# Patient Record
Sex: Female | Born: 1985 | Race: Black or African American | Hispanic: No | Marital: Single | Smoking: Former smoker
Health system: Southern US, Community
[De-identification: ages and names within clinical notes are randomized; demographics above are authoritative.]

## PROBLEM LIST (undated history)

## (undated) DIAGNOSIS — R87619 Unspecified abnormal cytological findings in specimens from cervix uteri: Secondary | ICD-10-CM

## (undated) DIAGNOSIS — I10 Essential (primary) hypertension: Secondary | ICD-10-CM

## (undated) DIAGNOSIS — Z87891 Personal history of nicotine dependence: Secondary | ICD-10-CM

## (undated) HISTORY — DX: Personal history of nicotine dependence: Z87.891

## (undated) HISTORY — DX: Essential (primary) hypertension: I10

## (undated) HISTORY — DX: Unspecified abnormal cytological findings in specimens from cervix uteri: R87.619

---
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
## 2007-10-03 ENCOUNTER — Emergency Department (HOSPITAL_COMMUNITY): Admission: EM | Admit: 2007-10-03 | Discharge: 2007-10-03 | Payer: Self-pay | Admitting: Emergency Medicine

## 2008-02-08 ENCOUNTER — Emergency Department (HOSPITAL_COMMUNITY): Admission: EM | Admit: 2008-02-08 | Discharge: 2008-02-08 | Payer: Self-pay | Admitting: Emergency Medicine

## 2010-11-24 ENCOUNTER — Emergency Department (HOSPITAL_COMMUNITY): Payer: No Typology Code available for payment source

## 2010-11-24 ENCOUNTER — Emergency Department (HOSPITAL_COMMUNITY)
Admission: EM | Admit: 2010-11-24 | Discharge: 2010-11-24 | Disposition: A | Discharge status: Home / Self Care | Payer: No Typology Code available for payment source | Attending: Emergency Medicine | Admitting: Emergency Medicine

## 2010-11-24 DIAGNOSIS — M542 Cervicalgia: Secondary | ICD-10-CM | POA: Insufficient documentation

## 2010-11-24 DIAGNOSIS — R51 Headache: Secondary | ICD-10-CM | POA: Insufficient documentation

## 2010-11-24 DIAGNOSIS — T148XXA Other injury of unspecified body region, initial encounter: Secondary | ICD-10-CM | POA: Insufficient documentation

## 2011-02-20 ENCOUNTER — Other Ambulatory Visit: Payer: Self-pay | Admitting: Family Medicine

## 2011-03-07 LAB — URINE MICROSCOPIC-ADD ON

## 2011-03-07 LAB — URINALYSIS, ROUTINE W REFLEX MICROSCOPIC
Bilirubin Urine: NEGATIVE
Nitrite: NEGATIVE
Specific Gravity, Urine: 1.019
Urobilinogen, UA: 0.2

## 2011-03-07 LAB — DIFFERENTIAL
Basophils Relative: 1
Eosinophils Relative: 2
Neutro Abs: 3.6

## 2011-03-07 LAB — COMPREHENSIVE METABOLIC PANEL
AST: 17
Albumin: 3.8
Alkaline Phosphatase: 53
BUN: 13
CO2: 25
GFR calc Af Amer: >60
GFR calc non Af Amer: >60
Potassium: 3.4 — ABNORMAL LOW
Sodium: 139
Total Bilirubin: 0.6

## 2011-03-07 LAB — TROPONIN I: Troponin I: 0.03

## 2011-03-07 LAB — CBC
MCV: 88.9
Platelets: 217

## 2011-03-07 LAB — CK TOTAL AND CKMB (NOT AT ARMC)
Relative Index: INVALID
Total CK: 85

## 2011-03-07 LAB — PREGNANCY, URINE: Preg Test, Ur: NEGATIVE

## 2011-04-14 ENCOUNTER — Emergency Department (HOSPITAL_COMMUNITY)
Admission: EM | Admit: 2011-04-14 | Discharge: 2011-04-14 | Disposition: A | Discharge status: Hospice | Payer: Self-pay | Source: Home / Self Care

## 2011-04-14 DIAGNOSIS — Z331 Pregnant state, incidental: Secondary | ICD-10-CM

## 2011-04-14 DIAGNOSIS — R1110 Vomiting, unspecified: Secondary | ICD-10-CM

## 2011-04-14 LAB — POCT URINALYSIS DIP (DEVICE)
Leukocytes, UA: NEGATIVE
Nitrite: NEGATIVE
Protein, ur: NEGATIVE mg/dL
Urobilinogen, UA: 0.2 mg/dL (ref 0.0–1.0)

## 2011-04-14 LAB — POCT PREGNANCY, URINE: Preg Test, Ur: POSITIVE

## 2011-04-14 MED ORDER — PROPRANOLOL HCL 80 MG PO CP24: 80.0000 mg | ORAL_CAPSULE | Freq: Every day | ORAL | Status: DC

## 2011-04-14 MED ORDER — ONDANSETRON HCL 4 MG PO TABS: 4.0000 mg | ORAL_TABLET | Freq: Four times a day (QID) | ORAL | Status: AC

## 2011-04-14 NOTE — ED Provider Notes (Signed)
Medical screening examination/treatment/procedure(s) were performed by non-physician practitioner and as supervising physician I was immediately available for consultation/collaboration.  Domenick Gong Azucena Freed Ayasha Ellingsen 04/14/11 2105

## 2011-04-14 NOTE — ED Provider Notes (Signed)
History     CSN: [id number] Arrival date & time: 04/14/2011  4:00 PM   First MD Initiated Contact with Patient 04/14/11 1705      Chief Complaint  Patient presents with  . Emesis    Pt has vomiting and low back pain since last pm    (Consider location/radiation/quality/duration/timing/severity/associated sxs/prior treatment) HPI Comments: Pt awoke at 2 or 3 am, vomited twice, none since.  Has been nauseated "for awhile now".   Patient is a 25 y.o. female presenting with vomiting. The history is provided by the patient.  Emesis  This is a new problem. Episode onset: 2am last night. Episode frequency: two times only. The problem has not changed since onset.Vomiting appearance: some blood, last night's dinner. There has been no fever. Associated symptoms include abdominal pain. Pertinent negatives include no chills, no cough, no diarrhea and no fever.    Past Medical History  Diagnosis Date  . Hypertension     History reviewed. No pertinent past surgical history.  History reviewed. No pertinent family history.  History  Substance Use Topics  . Smoking status: Passive Smoker  . Smokeless tobacco: Not on file  . Alcohol Use: Yes    OB History    Grav Para Term Preterm Abortions TAB SAB Ect Mult Living                  Review of Systems  Constitutional: Negative for fever and chills.  HENT: Negative for congestion and postnasal drip.   Respiratory: Negative for cough and shortness of breath.   Cardiovascular: Negative for chest pain.  Gastrointestinal: Positive for nausea, vomiting and abdominal pain. Negative for diarrhea.  Genitourinary: Positive for flank pain. Negative for dysuria, vaginal bleeding and vaginal discharge.  Neurological: Negative for dizziness.    Allergies  Review of patient's allergies indicates no known allergies.  Home Medications   Current Outpatient Rx  Name Route Sig Dispense Refill  . ATENOLOL 50 MG PO TABS Oral Take 50 mg by mouth  daily.      Marland Kitchen PROPRANOLOL HCL ER 160 MG PO CP24 Oral Take by mouth daily.        BP 119/79  Pulse 56  Temp(Src) 100.3 F (37.9 C) (Oral)  Resp 17  SpO2 97%  LMP 04/01/2011  Physical Exam  Constitutional: She appears well-developed and well-nourished. No distress.  Cardiovascular: Normal rate and regular rhythm.   Pulmonary/Chest: Effort normal and breath sounds normal.  Abdominal: Soft. Bowel sounds are normal. There is generalized tenderness. There is no CVA tenderness.    ED Course  Procedures (including critical care time)   Labs Reviewed  POCT URINALYSIS DIP (DEVICE)  POCT PREGNANCY, URINE  POCT URINALYSIS DIPSTICK  POCT PREGNANCY, URINE   No results found.   No diagnosis found.    MDM  Pt reports burgundy color in emesis she thinks was blood; pt appears stable, no evidence of a hemorrhage. Pt to monitor for hematemesis and it it happens again, return to [facility] and bring emesis.   Discussed at length with pt her options for pregnancy.       Cathlyn Parsons, NP 04/14/11 [phone]

## 2011-04-14 NOTE — ED Notes (Signed)
Pt has low back pain and vomiting that started last pm.  Pt states she is not on birth control and is not sexually active.

## 2011-04-22 ENCOUNTER — Encounter (HOSPITAL_COMMUNITY): Payer: Self-pay | Admitting: *Deleted

## 2011-04-22 ENCOUNTER — Emergency Department (INDEPENDENT_AMBULATORY_CARE_PROVIDER_SITE_OTHER)
Admission: EM | Admit: 2011-04-22 | Discharge: 2011-04-22 | Disposition: A | Discharge status: Home / Self Care | Payer: Self-pay | Source: Home / Self Care | Attending: Emergency Medicine | Admitting: Emergency Medicine

## 2011-04-22 DIAGNOSIS — O200 Threatened abortion: Secondary | ICD-10-CM

## 2011-04-22 LAB — POCT URINALYSIS DIP (DEVICE)
Glucose, UA: NEGATIVE mg/dL
Leukocytes, UA: NEGATIVE
Nitrite: NEGATIVE
Protein, ur: NEGATIVE mg/dL
Specific Gravity, Urine: 1.015 (ref 1.005–1.030)
Urobilinogen, UA: 0.2 mg/dL (ref 0.0–1.0)

## 2011-04-22 LAB — POCT PREGNANCY, URINE: Preg Test, Ur: NEGATIVE

## 2011-04-22 NOTE — ED Notes (Signed)
Co vaginal bleeding starting this am, with some cramping, not passing clots, states she was here on 11/10 and had positive preg test

## 2011-04-22 NOTE — ED Provider Notes (Signed)
History     CSN: [id number] Arrival date & time: 04/22/2011  1:51 PM   First MD Initiated Contact with Patient 04/22/11 1347      Chief Complaint  Patient presents with  . Vaginal Bleeding    (Consider location/radiation/quality/duration/timing/severity/associated sxs/prior treatment) HPI Comments: She was here on November 10 because of emesis and had a positive pregnancy test at that time. Her last menstrual period was around October 15. She normally uses condoms but had one condom that broke. She had just one episode of emesis, then it stopped. She denies any breast swelling, tenderness, or nipple soreness or tenderness. Today she had some vaginal bleeding. This seemed like a normal menstrual period. She states she usually has cramps with her menses and this feels like a the first day of her usual menses. She denies any severe pain. She's had no fever, no chills, no further nausea or vomiting. She denies passing any clots or tissue.  Patient is a 25 y.o. female presenting with vaginal bleeding.  Vaginal Bleeding Pertinent negatives include no abdominal pain.    Past Medical History  Diagnosis Date  . Hypertension     No past surgical history on file.  No family history on file.  History  Substance Use Topics  . Smoking status: Passive Smoker  . Smokeless tobacco: Not on file  . Alcohol Use: Yes    OB History    Grav Para Term Preterm Abortions TAB SAB Ect Mult Living                  Review of Systems  Constitutional: Negative for fever and chills.  Gastrointestinal: Negative for nausea, vomiting, abdominal pain and diarrhea.  Genitourinary: Positive for vaginal bleeding. Negative for dysuria, urgency, frequency, hematuria, vaginal discharge, difficulty urinating, genital sores, vaginal pain, menstrual problem, pelvic pain and dyspareunia.    Allergies  Review of patient's allergies indicates no known allergies.  Home Medications   Current Outpatient Rx  Name  Route Sig Dispense Refill  . ONDANSETRON HCL 4 MG PO TABS Oral Take 1 tablet (4 mg total) by mouth every 6 (six) hours. 12 tablet 0  . PROPRANOLOL HCL 80 MG PO CP24 Oral Take 1 capsule (80 mg total) by mouth daily. 30 capsule 1    BP 165/99  Pulse 49  Temp(Src) 99.2 F (37.3 C) (Oral)  Resp 15  SpO2 100%  LMP 04/01/2011  Physical Exam  Nursing note and vitals reviewed. Constitutional: She appears well-developed and well-nourished. No distress.  Cardiovascular: Normal rate, regular rhythm, normal heart sounds and intact distal pulses.  Exam reveals no gallop and no friction rub.   No murmur heard. Pulmonary/Chest: Effort normal and breath sounds normal. No respiratory distress. She has no wheezes. She has no rales.  Abdominal: Soft. Bowel sounds are normal. She exhibits no distension and no mass. There is no tenderness. There is no rebound and no guarding.  Genitourinary: Uterus normal. There is no rash or lesion on the right labia. There is no rash or lesion on the left labia. Uterus is not deviated, not enlarged, not fixed and not tender. Cervix exhibits motion tenderness (minimal cervical motion tenderness.). Cervix exhibits no discharge and no friability. Right adnexum displays tenderness ( Minimal tenderness to palpation without any mass or fullness.). Right adnexum displays no mass and no fullness. Left adnexum displays tenderness (mental tenderness to palpation without any mass or fullness.). Left adnexum displays no mass and no fullness. There is bleeding around the vagina. No  erythema or tenderness around the vagina. No foreign body around the vagina. No vaginal discharge found.       Pelvic exam reveals normal external genitalia. The cervix was normal. There was a small amount of blood in the vaginal vault. No tissue or clots. She has very minimal pain on cervical motion. Uterus was nonenlarged and nontender. She has very minimal bilateral adnexal tenderness to palpation. There were no  masses.  Skin: Skin is warm and dry. No rash noted. She is not diaphoretic.    ED Course  Procedures (including critical care time)  Labs Reviewed  POCT URINALYSIS DIP (DEVICE) - Abnormal; Notable for the following:    Hgb urine dipstick LARGE (*)    All other components within normal limits  POCT PREGNANCY, URINE  POCT URINALYSIS DIPSTICK  POCT PREGNANCY, URINE  GC/CHLAMYDIA PROBE AMP, GENITAL   Results for orders placed during the hospital encounter of 04/22/11  POCT URINALYSIS DIP (DEVICE)      Component Value Range   Glucose, UA NEGATIVE  NEGATIVE (mg/dL)   Bilirubin Urine NEGATIVE  NEGATIVE    Ketones, ur NEGATIVE  NEGATIVE (mg/dL)   Specific Gravity, Urine 1.015  1.005 - 1.030    Hgb urine dipstick LARGE (*) NEGATIVE    pH 6.0  5.0 - 8.0    Protein, ur NEGATIVE  NEGATIVE (mg/dL)   Urobilinogen, UA 0.2  0.0 - 1.0 (mg/dL)   Nitrite NEGATIVE  NEGATIVE    Leukocytes, UA NEGATIVE  NEGATIVE   POCT PREGNANCY, URINE      Component Value Range   Preg Test, Ur NEGATIVE        No results found.   1. Threatened miscarriage       MDM  This could be a threatened miscarriage or possibly the first pregnancy test was a false negative. There is no sign right now of a tubal ectopic, although I told her that if the pain or bleeding should get worse she should go to [facility] for further evaluation.        Roque Lias, MD 04/22/11 [phone]

## 2011-04-23 LAB — GC/CHLAMYDIA PROBE AMP, GENITAL: GC Probe Amp, Genital: NEGATIVE

## 2012-03-04 ENCOUNTER — Emergency Department (INDEPENDENT_AMBULATORY_CARE_PROVIDER_SITE_OTHER)
Admission: EM | Admit: 2012-03-04 | Discharge: 2012-03-04 | Disposition: A | Discharge status: Home / Self Care | Payer: Self-pay | Source: Home / Self Care | Attending: Family Medicine | Admitting: Family Medicine

## 2012-03-04 ENCOUNTER — Encounter (HOSPITAL_COMMUNITY): Payer: Self-pay | Admitting: Emergency Medicine

## 2012-03-04 DIAGNOSIS — R21 Rash and other nonspecific skin eruption: Secondary | ICD-10-CM

## 2012-03-04 MED ORDER — TRIAMCINOLONE ACETONIDE 40 MG/ML IJ SUSP
INTRAMUSCULAR | Status: AC
  Filled 2012-03-04: qty 5

## 2012-03-04 MED ORDER — CETIRIZINE HCL 10 MG PO TABS: 10.0000 mg | ORAL_TABLET | Freq: Every day | ORAL | Status: DC

## 2012-03-04 MED ORDER — TRIAMCINOLONE ACETONIDE 40 MG/ML IJ SUSP
40.0000 mg | Freq: Once | INTRAMUSCULAR | Status: AC
  Administered 2012-03-04: 40 mg via INTRAMUSCULAR

## 2012-03-04 MED ORDER — METHYLPREDNISOLONE 4 MG PO KIT: PACK | ORAL | Status: DC

## 2012-03-04 MED ORDER — TRIAMCINOLONE ACETONIDE 0.1 % EX CREA: TOPICAL_CREAM | Freq: Two times a day (BID) | CUTANEOUS | Status: DC

## 2012-03-04 NOTE — ED Provider Notes (Signed)
History     CSN: [id number]  Arrival date & time 03/04/12  1716   None     Chief Complaint  Patient presents with  . Rash    (Consider location/radiation/quality/duration/timing/severity/associated sxs/prior treatment) The history is provided by the patient.  This patient complains of a pruritic rash.  Location: bilateral forearms and bilateral feet/legs  Onset: 4 days ago   Course: worsening Self-treated with: cortaid and benadryl            Improvement with treatment: minimal  History Itching: yes  Tenderness: no  New medications/antibiotics: no  Pet exposure: no  Recent travel or tropical exposure: no  New soaps, shampoos, detergent, clothing: no Tick/insect exposure: possible bed bug exposure while sleeping at a friend house  Red Flags Feeling ill: no Fever:no Facial/tongue swelling/difficulty breathing:  no  Diabetic or immunocompromised: no  Past Medical History  Diagnosis Date  . Hypertension     History reviewed. No pertinent past surgical history.  No family history on file.  History  Substance Use Topics  . Smoking status: Passive Smoke Exposure - Never Smoker  . Smokeless tobacco: Not on file  . Alcohol Use: Yes    OB History    Grav Para Term Preterm Abortions TAB SAB Ect Mult Living                  Review of Systems  Constitutional: Negative.   Respiratory: Negative.   Cardiovascular: Negative.   Musculoskeletal: Negative.   Skin: Positive for rash. Negative for color change, pallor and wound.    Allergies  Review of patient's allergies indicates no known allergies.  Home Medications   Current Outpatient Rx  Name Route Sig Dispense Refill  . AMLODIPINE BESYLATE 5 MG PO TABS Oral Take 5 mg by mouth daily.    Marland Kitchen CETIRIZINE HCL 10 MG PO TABS Oral Take 1 tablet (10 mg total) by mouth daily. 30 tablet 1  . METHYLPREDNISOLONE 4 MG PO KIT  follow package directions 21 tablet 0  . PROPRANOLOL HCL 80 MG PO CP24 Oral Take 1 capsule (80  mg total) by mouth daily. 30 capsule 1  . TRIAMCINOLONE ACETONIDE 0.1 % EX CREA Topical Apply topically 2 (two) times daily. 30 g 2    BP 144/90  Pulse 60  Temp 98.1 F (36.7 C) (Oral)  Resp 16  SpO2 100%  LMP 02/03/2012  Physical Exam  Nursing note and vitals reviewed. Constitutional: She is oriented to person, place, and time. Vital signs are normal. She appears well-developed and well-nourished. She is active and cooperative.  HENT:  Head: Normocephalic.  Eyes: Conjunctivae normal are normal. Pupils are equal, round, and reactive to light. No scleral icterus.  Neck: Trachea normal. Neck supple.  Cardiovascular: Normal rate and regular rhythm.   Pulmonary/Chest: Effort normal and breath sounds normal.  Neurological: She is alert and oriented to person, place, and time. No cranial nerve deficit or sensory deficit.  Skin: Skin is warm and dry. Rash noted. Rash is maculopapular.          Erythematous papular rash varying in size, less than 2cm, many with vesicular appearance, torso spared  Psychiatric: She has a normal mood and affect. Her speech is normal and behavior is normal. Judgment and thought content normal. Cognition and memory are normal.    ED Course  Procedures (including critical care time)  Labs Reviewed - No data to display No results found.   1. Rash and nonspecific skin eruption  MDM  Cool showers; avoid heat, sunlight and anything that makes condition worse.  Zyrtec during day, benadryl at night for at least seven days.  Begin Medrol dosepak tomorrow-follow instructions.  RTC if symptoms do not improve or begin to have problems swallowing, breathing or significant change in condition.        Johnsie Kindred, NP 03/04/12 2023

## 2012-03-04 NOTE — ED Notes (Signed)
Pt here with outbreak of red raised bumps all over with severe itching that started last Friday, after spending the night over friends house that had bed bugs.bumps seen all over.pt using benadryl and cortisone

## 2012-03-04 NOTE — ED Provider Notes (Signed)
Medical screening examination/treatment/procedure(s) were performed by resident physician or non-physician practitioner and as supervising physician I was immediately available for consultation/collaboration.   Barkley Bruns MD.    Linna Hoff, MD 03/04/12 2113

## 2012-04-16 ENCOUNTER — Emergency Department (HOSPITAL_COMMUNITY)
Admission: EM | Admit: 2012-04-16 | Discharge: 2012-04-17 | Disposition: A | Discharge status: Home / Self Care | Payer: Self-pay | Attending: Emergency Medicine | Admitting: Emergency Medicine

## 2012-04-16 ENCOUNTER — Encounter (HOSPITAL_COMMUNITY): Payer: Self-pay | Admitting: Emergency Medicine

## 2012-04-16 DIAGNOSIS — M79646 Pain in unspecified finger(s): Secondary | ICD-10-CM

## 2012-04-16 DIAGNOSIS — M79609 Pain in unspecified limb: Secondary | ICD-10-CM | POA: Insufficient documentation

## 2012-04-16 NOTE — ED Notes (Signed)
Pt presents to the ED with a complaint of right hand pain.  Pt states that the pain is in her palm then radiates to her thumb and down the posterior forearm.  Pt presented to the ED with a hand brace but since has removed it.  Patient states that the hand has been bothering her for about a month.

## 2012-04-17 NOTE — ED Provider Notes (Signed)
History     CSN: [id number]  Arrival date & time 04/16/12  2248   First MD Initiated Contact with Patient 04/17/12 0005      Chief Complaint  Patient presents with  . Hand Pain    (Consider location/radiation/quality/duration/timing/severity/associated sxs/prior treatment) HPI Comments: Patient started noticing pain in the R palm at the base of the thumb while texting about 1 month ago About 2 weeks ago she started wearing a wrist splint and taking Ibuprofen with some relief  She also started texting with her L hand now having pain at base of L thumb   Patient is a 26 y.o. female presenting with hand pain. The history is provided by the patient.  Hand Pain This is a chronic problem. The current episode started 1 to 4 weeks ago. The problem occurs constantly. The problem has been unchanged. Pertinent negatives include no chills, fever, joint swelling, numbness or weakness.    History reviewed. No pertinent past medical history.  History reviewed. No pertinent past surgical history.  History reviewed. No pertinent family history.  History  Substance Use Topics  . Smoking status: Passive Smoke Exposure - Never Smoker  . Smokeless tobacco: Not on file  . Alcohol Use: Yes    OB History    Grav Para Term Preterm Abortions TAB SAB Ect Mult Living                  Review of Systems  Constitutional: Negative for fever and chills.  HENT: Negative.   Eyes: Negative.   Respiratory: Negative.   Cardiovascular: Negative.   Gastrointestinal: Negative.   Genitourinary: Negative.   Musculoskeletal: Negative for joint swelling.  Neurological: Negative for weakness and numbness.    Allergies  Review of patient's allergies indicates no known allergies.  Home Medications   Current Outpatient Rx  Name  Route  Sig  Dispense  Refill  . AMLODIPINE BESYLATE 5 MG PO TABS   Oral   Take 5 mg by mouth daily.         . METHYLPREDNISOLONE 4 MG PO KIT      follow package  directions   21 tablet   0   . PROPRANOLOL HCL 80 MG PO CP24   Oral   Take 1 capsule (80 mg total) by mouth daily.   30 capsule   1     BP 135/91  Pulse 90  Temp 97.4 F (36.3 C) (Oral)  Resp 18  Ht 5\' 3"  (1.6 m)  SpO2 100%  LMP 04/14/2012  Physical Exam  Constitutional: She is oriented to person, place, and time. She appears well-developed and well-nourished.  Eyes: Pupils are equal, round, and reactive to light.  Neck: Normal range of motion.  Cardiovascular: Normal rate.   Musculoskeletal: Normal range of motion. She exhibits edema. She exhibits no tenderness.  Neurological: She is alert and oriented to person, place, and time.  Skin: Skin is warm.    ED Course  Procedures (including critical care time)  Labs Reviewed - No data to display No results found.   1. Thumb pain       MDM  texting thumb syndrome will apply Velcro thumb spica splint and provide information on syndrome refer to hand surgery for follow up         Arman Filter, NP 04/17/12 [phone]

## 2012-04-17 NOTE — ED Provider Notes (Signed)
Medical screening examination/treatment/procedure(s) were performed by non-physician practitioner and as supervising physician I was immediately available for consultation/collaboration.  Sunnie Nielsen, MD 04/17/12 [phone]

## 2012-11-26 ENCOUNTER — Encounter (HOSPITAL_COMMUNITY): Payer: Self-pay | Admitting: Emergency Medicine

## 2012-11-26 ENCOUNTER — Emergency Department (HOSPITAL_COMMUNITY)
Admission: EM | Admit: 2012-11-26 | Discharge: 2012-11-26 | Disposition: A | Discharge status: Home / Self Care | Payer: Self-pay | Attending: Emergency Medicine | Admitting: Emergency Medicine

## 2012-11-26 DIAGNOSIS — Z3202 Encounter for pregnancy test, result negative: Secondary | ICD-10-CM | POA: Insufficient documentation

## 2012-11-26 DIAGNOSIS — Z79899 Other long term (current) drug therapy: Secondary | ICD-10-CM | POA: Insufficient documentation

## 2012-11-26 DIAGNOSIS — N898 Other specified noninflammatory disorders of vagina: Secondary | ICD-10-CM | POA: Insufficient documentation

## 2012-11-26 DIAGNOSIS — Z113 Encounter for screening for infections with a predominantly sexual mode of transmission: Secondary | ICD-10-CM | POA: Insufficient documentation

## 2012-11-26 DIAGNOSIS — Z202 Contact with and (suspected) exposure to infections with a predominantly sexual mode of transmission: Secondary | ICD-10-CM

## 2012-11-26 LAB — URINALYSIS, ROUTINE W REFLEX MICROSCOPIC
Bilirubin Urine: NEGATIVE
Glucose, UA: NEGATIVE mg/dL
Hgb urine dipstick: NEGATIVE
Ketones, ur: NEGATIVE mg/dL
Protein, ur: NEGATIVE mg/dL
Urobilinogen, UA: 0.2 mg/dL (ref 0.0–1.0)

## 2012-11-26 LAB — POCT PREGNANCY, URINE: Preg Test, Ur: NEGATIVE

## 2012-11-26 LAB — WET PREP, GENITAL
Trich, Wet Prep: NONE SEEN
Yeast Wet Prep HPF POC: NONE SEEN

## 2012-11-26 LAB — RPR: RPR Ser Ql: NONREACTIVE

## 2012-11-26 LAB — URINE MICROSCOPIC-ADD ON

## 2012-11-26 LAB — HIV ANTIBODY (ROUTINE TESTING W REFLEX): HIV: NONREACTIVE

## 2012-11-26 MED ORDER — CEFTRIAXONE SODIUM 250 MG IJ SOLR
250.0000 mg | Freq: Once | INTRAMUSCULAR | Status: AC
  Administered 2012-11-26: 250 mg via INTRAMUSCULAR
  Filled 2012-11-26: qty 250

## 2012-11-26 MED ORDER — METRONIDAZOLE 500 MG PO TABS: 500.0000 mg | ORAL_TABLET | Freq: Two times a day (BID) | ORAL | Status: DC

## 2012-11-26 MED ORDER — DOXYCYCLINE HYCLATE 100 MG PO CAPS: 100.0000 mg | ORAL_CAPSULE | Freq: Two times a day (BID) | ORAL | Status: DC

## 2012-11-26 NOTE — Progress Notes (Signed)
[facility] has seen patient and provided her with a list of primary care resources, highlighting the [facility] Department.

## 2012-11-26 NOTE — ED Provider Notes (Signed)
History    CSN: [id number] Arrival date & time 11/26/12  1308  First MD Initiated Contact with Patient 11/26/12 1401     Chief Complaint  Patient presents with  . Exposure to STD   (Consider location/radiation/quality/duration/timing/severity/associated sxs/prior Treatment) Patient is a 27 y.o. female presenting with STD exposure. The history is provided by the patient.  Exposure to STD This is a new problem. Pertinent negatives include no chest pain, no abdominal pain, no headaches and no shortness of breath.   patient states that she heard a rumor that her boyfriend has an STD. She came in to be checked out. She states she's had a little brown vaginal discharge. She states this is normal for her. She does not know when her last period was.she states that she does not keep track. She states she does not know if her boyfriend has an STD she just her room.  History reviewed. No pertinent past medical history. History reviewed. No pertinent past surgical history. History reviewed. No pertinent family history. History  Substance Use Topics  . Smoking status: Current Some Day Smoker  . Smokeless tobacco: Not on file  . Alcohol Use: Yes   OB History   Grav Para Term Preterm Abortions TAB SAB Ect Mult Living                 Review of Systems  Constitutional: Negative for activity change and appetite change.  HENT: Negative for neck stiffness.   Eyes: Negative for pain.  Respiratory: Negative for chest tightness and shortness of breath.   Cardiovascular: Negative for chest pain and leg swelling.  Gastrointestinal: Negative for nausea, vomiting, abdominal pain and diarrhea.  Genitourinary: Positive for vaginal discharge. Negative for hematuria and flank pain.  Musculoskeletal: Negative for back pain.  Skin: Negative for rash.  Neurological: Negative for weakness, numbness and headaches.  Psychiatric/Behavioral: Negative for behavioral problems.    Allergies  Review of patient's  allergies indicates no known allergies.  Home Medications   Current Outpatient Rx  Name  Route  Sig  Dispense  Refill  . amLODipine (NORVASC) 5 MG tablet   Oral   Take 5 mg by mouth daily.         Marland Kitchen doxycycline (VIBRAMYCIN) 100 MG capsule   Oral   Take 1 capsule (100 mg total) by mouth 2 (two) times daily.   14 capsule   0   . metroNIDAZOLE (FLAGYL) 500 MG tablet   Oral   Take 1 tablet (500 mg total) by mouth 2 (two) times daily.   14 tablet   0    BP 157/100  Pulse 65  Temp(Src) 98.6 F (37 C) (Oral)  Resp 16  SpO2 100%  LMP 10/26/2012 Physical Exam  Nursing note and vitals reviewed. Constitutional: She is oriented to person, place, and time. She appears well-developed and well-nourished.  HENT:  Head: Normocephalic and atraumatic.  Eyes: EOM are normal. Pupils are equal, round, and reactive to light.  Neck: Normal range of motion. Neck supple.  Cardiovascular: Normal rate, regular rhythm and normal heart sounds.   No murmur heard. Pulmonary/Chest: Effort normal and breath sounds normal. No respiratory distress. She has no wheezes. She has no rales.  Abdominal: Soft. Bowel sounds are normal. She exhibits no distension. There is no tenderness. There is no rebound and no guarding.  Musculoskeletal: Normal range of motion.  Neurological: She is alert and oriented to person, place, and time. No cranial nerve deficit.  Skin: Skin is  warm and dry.  Psychiatric: She has a normal mood and affect. Her speech is normal.   patient with white vaginal discharge. No cervical motion tenderness. No adnexalTenderness.  ED Course  Procedures (including critical care time) Labs Reviewed  WET PREP, GENITAL - Abnormal; Notable for the following:    Clue Cells Wet Prep HPF POC MODERATE (*)    WBC, Wet Prep HPF POC TOO NUMEROUS TO COUNT (*)    All other components within normal limits  URINALYSIS, ROUTINE W REFLEX MICROSCOPIC - Abnormal; Notable for the following:    APPearance  CLOUDY (*)    Leukocytes, UA LARGE (*)    All other components within normal limits  URINE MICROSCOPIC-ADD ON - Abnormal; Notable for the following:    Squamous Epithelial / LPF FEW (*)    Bacteria, UA FEW (*)    All other components within normal limits  GC/CHLAMYDIA PROBE AMP  URINE CULTURE  RPR  HIV ANTIBODY (ROUTINE TESTING)  POCT PREGNANCY, URINE   No results found. 1. Exposure to STD     MDM  Patient with white vaginal discharge. States she was told her boyfriend she has an STD. We'll treat for GC chlamydia and a BB that was found on wet prep. RPR and HIV pending  Juliet Rude. Rubin Payor, MD 11/26/12 [phone]

## 2012-11-26 NOTE — ED Notes (Signed)
Pt states that she heard a rumor that her boyfriend has an STD.  States that she has been having brown vaginal discharge x 1 wk.

## 2012-11-27 LAB — URINE CULTURE: Colony Count: NO GROWTH

## 2013-03-22 ENCOUNTER — Emergency Department (HOSPITAL_COMMUNITY)
Admission: EM | Admit: 2013-03-22 | Discharge: 2013-03-22 | Disposition: A | Discharge status: Home / Self Care | Payer: Self-pay | Attending: Emergency Medicine | Admitting: Emergency Medicine

## 2013-03-22 ENCOUNTER — Encounter (HOSPITAL_COMMUNITY): Payer: Self-pay | Admitting: Emergency Medicine

## 2013-03-22 DIAGNOSIS — F17200 Nicotine dependence, unspecified, uncomplicated: Secondary | ICD-10-CM | POA: Insufficient documentation

## 2013-03-22 DIAGNOSIS — I10 Essential (primary) hypertension: Secondary | ICD-10-CM | POA: Insufficient documentation

## 2013-03-22 DIAGNOSIS — R51 Headache: Secondary | ICD-10-CM | POA: Insufficient documentation

## 2013-03-22 DIAGNOSIS — Z79899 Other long term (current) drug therapy: Secondary | ICD-10-CM | POA: Insufficient documentation

## 2013-03-22 MED ORDER — TRAMADOL HCL 50 MG PO TABS
50.0000 mg | ORAL_TABLET | Freq: Once | ORAL | Status: AC
  Administered 2013-03-22: 50 mg via ORAL
  Filled 2013-03-22: qty 1

## 2013-03-22 MED ORDER — DIPHENHYDRAMINE HCL 25 MG PO CAPS
25.0000 mg | ORAL_CAPSULE | Freq: Once | ORAL | Status: AC
  Administered 2013-03-22: 25 mg via ORAL
  Filled 2013-03-22: qty 1

## 2013-03-22 MED ORDER — IBUPROFEN 400 MG PO TABS
600.0000 mg | ORAL_TABLET | Freq: Once | ORAL | Status: AC
  Administered 2013-03-22: 600 mg via ORAL
  Filled 2013-03-22 (×2): qty 1

## 2013-03-22 MED ORDER — AMLODIPINE BESYLATE 10 MG PO TABS: 5.0000 mg | ORAL_TABLET | Freq: Every day | ORAL | Status: DC

## 2013-03-22 NOTE — ED Notes (Signed)
Pt. Reports intermittent headache for 2 days unrelieved by OTC Aleve , denies injury , no nausea or vomitting , no fever .

## 2013-03-22 NOTE — ED Provider Notes (Signed)
CSN: [id number]     Arrival date & time 03/22/13  2050 History  This chart was scribed for non-physician practitioner Magnus Sinning, PA-C, working with Rolan Bucco, MD by Dorothey Baseman, ED Scribe. This patient was seen in room TR06C/TR06C and the patient's care was started at 10:05 PM.    Chief Complaint  Patient presents with  . Headache   The history is provided by the patient. No language interpreter was used.   HPI Comments: Amber Little is a 27 y.o. female who presents to the Emergency Department complaining of an intermittent gradual onset headache to the occipital region that radiates to the forehead onset 2 days ago.  Patient denies any potential injury to the area or recent similar symptoms. She reports taking Aleve at home without relief. She denies vision changes, nausea, emesis, dizziness, or fever. Patient reports a history of HTN and states that she ran out of her medication recently and will occasionally get headaches when her HTN is not well-controlled.  She reports that she has had similar headaches in the past.  History reviewed. No pertinent past medical history. History reviewed. No pertinent past surgical history. No family history on file. History  Substance Use Topics  . Smoking status: Current Some Day Smoker  . Smokeless tobacco: Not on file  . Alcohol Use: Yes   OB History   Grav Para Term Preterm Abortions TAB SAB Ect Mult Living                 Review of Systems  A complete 10 system review of systems was obtained and all systems are negative except as noted in the HPI and PMH.   Allergies  Review of patient's allergies indicates no known allergies.  Home Medications   Current Outpatient Rx  Name  Route  Sig  Dispense  Refill  . amLODipine (NORVASC) 5 MG tablet   Oral   Take 5 mg by mouth daily.          Triage Vitals: BP 156/89  Pulse 57  Temp(Src) 98.4 F (36.9 C) (Oral)  Resp 16  Wt 113 lb 11.2 oz (51.574 kg)  BMI 20.15 kg/m2   SpO2 100%  LMP 02/16/2013  Physical Exam  Nursing note and vitals reviewed. Constitutional: She is oriented to person, place, and time. She appears well-developed and well-nourished. No distress.  HENT:  Head: Normocephalic and atraumatic.  Eyes: Conjunctivae and EOM are normal. Pupils are equal, round, and reactive to light.  Neck: Normal range of motion. Neck supple.  Cardiovascular: Normal rate, regular rhythm and normal heart sounds.   Pulmonary/Chest: Effort normal and breath sounds normal. No respiratory distress.  Abdominal: She exhibits no distension.  Musculoskeletal: Normal range of motion.  Neurological: She is alert and oriented to person, place, and time. No cranial nerve deficit.  Normal strength and sensation throughout. Normal finger-to-nose bilaterally. Normal rapid alternating movements.   Skin: Skin is warm and dry.  Psychiatric: She has a normal mood and affect. Her behavior is normal.    ED Course  Procedures (including critical care time)  DIAGNOSTIC STUDIES: Oxygen Saturation is 100% on room air, normal by my interpretation.    COORDINATION OF CARE: 10:09 PM- Offered and injection of Toradol, but patient refused. Will order 800 mg of ibuprofen and Ultram to manage symptoms. Will discharge patient with a refill of Norvasc at her request. Discussed treatment plan with patient at bedside and patient verbalized agreement.     Labs Review  Labs Reviewed - No data to display Imaging Review No results found.  EKG Interpretation   None      10:57 PM Reassessed patient.  She reports that her headache has resolved at this time. MDM  No diagnosis found. Pt HA treated and improved while in ED.  Presentation is like pts typical HA and non concerning for [facility], ICH, Meningitis, or temporal arteritis. Pt is afebrile with no focal neuro deficits, nuchal rigidity, or change in vision. Pt is to follow up with PCP. Pt verbalizes understanding and is agreeable with plan  to dc. Return precautions given.  I personally performed the services described in this documentation, which was scribed in my presence. The recorded information has been reviewed and is accurate.     Pascal Lux [place], PA-C 03/23/13 [phone]

## 2013-03-23 ENCOUNTER — Telehealth (HOSPITAL_COMMUNITY): Payer: Self-pay | Admitting: Emergency Medicine

## 2013-03-23 NOTE — ED Notes (Signed)
Employer faxed [place] a work note to see if we could verify- we advised that we could not verify work note at this time.

## 2013-03-23 NOTE — ED Provider Notes (Signed)
Medical screening examination/treatment/procedure(s) were performed by non-physician practitioner and as supervising physician I was immediately available for consultation/collaboration.   Rolan Bucco, MD 03/23/13 [phone]

## 2013-03-31 ENCOUNTER — Emergency Department (HOSPITAL_COMMUNITY)
Admission: EM | Admit: 2013-03-31 | Discharge: 2013-04-01 | Disposition: A | Discharge status: Home / Self Care | Payer: Self-pay | Attending: Emergency Medicine | Admitting: Emergency Medicine

## 2013-03-31 ENCOUNTER — Emergency Department (HOSPITAL_COMMUNITY): Admission: EM | Admit: 2013-03-31 | Discharge: 2013-03-31 | Discharge status: Against Medical Advice | Payer: Self-pay

## 2013-03-31 ENCOUNTER — Encounter (HOSPITAL_COMMUNITY): Payer: Self-pay | Admitting: Emergency Medicine

## 2013-03-31 DIAGNOSIS — Z79899 Other long term (current) drug therapy: Secondary | ICD-10-CM | POA: Insufficient documentation

## 2013-03-31 DIAGNOSIS — A6009 Herpesviral infection of other urogenital tract: Secondary | ICD-10-CM

## 2013-03-31 DIAGNOSIS — I10 Essential (primary) hypertension: Secondary | ICD-10-CM | POA: Insufficient documentation

## 2013-03-31 DIAGNOSIS — N760 Acute vaginitis: Secondary | ICD-10-CM | POA: Insufficient documentation

## 2013-03-31 DIAGNOSIS — Z3202 Encounter for pregnancy test, result negative: Secondary | ICD-10-CM | POA: Insufficient documentation

## 2013-03-31 DIAGNOSIS — A499 Bacterial infection, unspecified: Secondary | ICD-10-CM | POA: Insufficient documentation

## 2013-03-31 DIAGNOSIS — B9689 Other specified bacterial agents as the cause of diseases classified elsewhere: Secondary | ICD-10-CM | POA: Insufficient documentation

## 2013-03-31 DIAGNOSIS — F17200 Nicotine dependence, unspecified, uncomplicated: Secondary | ICD-10-CM | POA: Insufficient documentation

## 2013-03-31 DIAGNOSIS — A6000 Herpesviral infection of urogenital system, unspecified: Secondary | ICD-10-CM | POA: Insufficient documentation

## 2013-03-31 NOTE — ED Notes (Signed)
Pt reports vaginal pain without discharge x3 days, denies n/v/d.

## 2013-03-31 NOTE — ED Provider Notes (Signed)
CSN: [id number]     Arrival date & time 03/31/13  2025 History   First MD Initiated Contact with Patient 03/31/13 2305     Chief Complaint  Patient presents with  . Vaginal Pain    HPI  Amber Little is a 27 y.o. female with a PMH of HTN who presents to the ED for evaluation of vaginal pain.  History was provided by the patient.  Patient states that she has had gradually worsening constant vaginal pain and sores for the past three days.  She states she has genital sores to her external labia.  She "popped" one of the sores.  She states the sores are very painful and it hurts to wipe.  She describes a burning type of pain.  She denies any vaginal discharge or bleeding.  She is currently sexually active with no new partners.  She denies any dysuria, hematuria, abdominal pain, pelvic pain, fever, chills, change in appetite/activity, nausea, emesis, oral sores, diarrhea, and constipation.  She has not done anything to treat her symptoms.  She denies any history of herpes infection in the past.  Her LNMP 03/28/13.     History reviewed. No pertinent past medical history. History reviewed. No pertinent past surgical history. History reviewed. No pertinent family history. History  Substance Use Topics  . Smoking status: Current Some Day Smoker -- 0.50 packs/day    Types: Cigarettes  . Smokeless tobacco: Never Used  . Alcohol Use: Yes     Comment: rarely   OB History   Grav Para Term Preterm Abortions TAB SAB Ect Mult Living                 Review of Systems  Constitutional: Negative for fever, chills, activity change, appetite change and fatigue.  HENT: Negative for congestion, mouth sores, rhinorrhea and sore throat.   Respiratory: Negative for cough and shortness of breath.   Cardiovascular: Negative for chest pain.  Gastrointestinal: Negative for nausea, vomiting, abdominal pain, diarrhea, constipation and abdominal distention.  Genitourinary: Positive for genital sores and vaginal  pain. Negative for dysuria, urgency, hematuria, flank pain, decreased urine volume, vaginal bleeding, vaginal discharge, difficulty urinating and pelvic pain.  Musculoskeletal: Negative for back pain and gait problem.  Skin: Negative for rash and wound.  Neurological: Negative for dizziness, weakness, light-headedness and headaches.    Allergies  Review of patient's allergies indicates no known allergies.  Home Medications   Current Outpatient Rx  Name  Route  Sig  Dispense  Refill  . amLODipine (NORVASC) 5 MG tablet   Oral   Take 5 mg by mouth daily.         . naproxen (NAPROSYN) 250 MG tablet   Oral   Take 250 mg by mouth 2 (two) times daily as needed (PAIN).          BP 156/87  Pulse 73  Temp(Src) 98.5 F (36.9 C) (Oral)  Resp 16  Ht 5\' 3"  (1.6 m)  Wt 115 lb (52.164 kg)  BMI 20.38 kg/m2  SpO2 98%  LMP 03/28/2013  Filed Vitals:   03/31/13 2052 04/01/13 0106  BP: 156/87 148/99  Pulse: 73 52  Temp: 98.5 F (36.9 C) 98.4 F (36.9 C)  TempSrc: Oral Oral  Resp: 16 18  Height: 5\' 3"  (1.6 m)   Weight: 115 lb (52.164 kg)   SpO2: 98% 100%    Physical Exam  Nursing note and vitals reviewed. Constitutional: She is oriented to person, place, and time. She  appears well-developed and well-nourished. No distress.  HENT:  Head: Normocephalic and atraumatic.  Nose: Nose normal.  Mouth/Throat: Oropharynx is clear and moist. No oropharyngeal exudate.  Eyes: Conjunctivae are normal. Right eye exhibits no discharge. Left eye exhibits no discharge.  Neck: Normal range of motion. Neck supple.  Cardiovascular: Normal rate, regular rhythm and normal heart sounds.  Exam reveals no gallop and no friction rub.   No murmur heard. Pulmonary/Chest: Effort normal and breath sounds normal. No respiratory distress. She has no wheezes. She has no rales. She exhibits no tenderness.  Abdominal: Soft. She exhibits no distension and no mass. There is no tenderness. There is no rebound and  no guarding.  Musculoskeletal: Normal range of motion. She exhibits no edema and no tenderness.  No CVA or lumbar tenderness.  Patient able to ambulate without difficulty or ataxia  Neurological: She is alert and oriented to person, place, and time.  Skin: Skin is warm and dry. She is not diaphoretic.    ED Course  Procedures (including critical care time) Labs Review Labs Reviewed - No data to display Imaging Review No results found.  EKG Interpretation   None      Results for orders placed during the hospital encounter of 03/31/13  WET PREP, GENITAL      Result Value Range   Yeast Wet Prep HPF POC NONE SEEN  NONE SEEN   Trich, Wet Prep NONE SEEN  NONE SEEN   Clue Cells Wet Prep HPF POC MANY (*) NONE SEEN   WBC, Wet Prep HPF POC MODERATE (*) NONE SEEN  GC/CHLAMYDIA PROBE AMP      Result Value Range   CT Probe RNA NEGATIVE  NEGATIVE   GC Probe RNA NEGATIVE  NEGATIVE  HERPES SIMPLEX VIRUS CULTURE      Result Value Range   Specimen Description VAGINA LESION     Special Requests Normal     Culture       Value: Culture has been initiated.     Performed at [facility]   Report Status PENDING    URINE CULTURE      Result Value Range   Specimen Description URINE, CLEAN CATCH     Special Requests NONE     Culture  Setup Time       Value: 04/01/2013 03:28     Performed at [facility] Count       Value: NO GROWTH     Performed at [facility]   Culture       Value: NO GROWTH     Performed at [facility]   Report Status 04/02/2013 FINAL    PREGNANCY, URINE      Result Value Range   Preg Test, Ur NEGATIVE  NEGATIVE  URINALYSIS, ROUTINE W REFLEX MICROSCOPIC      Result Value Range   Color, Urine YELLOW  YELLOW   APPearance CLOUDY (*) CLEAR   Specific Gravity, Urine 1.022  1.005 - 1.030   pH 7.5  5.0 - 8.0   Glucose, UA NEGATIVE  NEGATIVE mg/dL   Hgb urine dipstick NEGATIVE  NEGATIVE   Bilirubin Urine NEGATIVE  NEGATIVE    Ketones, ur NEGATIVE  NEGATIVE mg/dL   Protein, ur NEGATIVE  NEGATIVE mg/dL   Urobilinogen, UA 0.2  0.0 - 1.0 mg/dL   Nitrite NEGATIVE  NEGATIVE   Leukocytes, UA SMALL (*) NEGATIVE  RPR      Result Value Range   RPR  NON REACTIVE  NON REACTIVE  HIV ANTIBODY (ROUTINE TESTING)      Result Value Range   HIV NON REACTIVE  NON REACTIVE  URINE MICROSCOPIC-ADD ON      Result Value Range   Squamous Epithelial / LPF MANY (*) RARE   WBC, UA 7-10  <3 WBC/hpf   Bacteria, UA MANY (*) RARE   Urine-Other MUCOUS PRESENT       MDM   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 Bacterial vaginosis     Amber Little is a 28 y.o. female with a PMH of HTN who presents to the ED for evaluation of vaginal pain.  Urine pregnancy and urinalysis ordered.  HIV and RPR testing ordered.     Rechecks  11:45 PM = Pelvic exam performed at bedside with RN present.  Multiple (approximately 10) 3 mm circular open/ruptured and closed vesicles present on the labia majora and minora.  Vesicles are filled with a yellow fluid with an underlying area of erythema.  Moderate amount of thick white-yellow discharge present in the vaginal vault.  Exam limited due to pain.  Unable to visualize cervix.  Swabs sent to lab for wet mount and gonorrhea/chlamydia. No appreciable tenderness on bimanual exam.  Swabs of genital sores sent for herpes culture.   1:00 AM = Patient comfortable on her cell phone.  Discussed results.      Etiology of genital sores possibly due to a herpes infection.  Patient has vesicles present in the external labia, which are tender to palpation.  Patient started on Acyclovir.  Herpes cultures sent for analysis.  Etiology of vaginal discharge possibly due to BV. Patient given prescription for Flagyl.  UA not significant for UTI at this time. Will send for culture.  She was instructed to follow-up at the [facility] for further evaluation and management.  She was afebrile, non-toxic in appearance, and her  abdominal exam was benign.  Patient instructed to return to the ED if she has any abdominal/pelvic pain, repeated vomiting, back pain, fever, worsening symptoms, difficulty urinating/emptying your bladder, or other concerns.  Patient in agreement with discharge and plan.     Final impressions: 1. Genital herpes  2. Bacterial vaginosis    Greer Ee Erico Stan PA-C        Jillyn Ledger, [place] 04/02/13 1215

## 2013-04-01 LAB — WET PREP, GENITAL: Trich, Wet Prep: NONE SEEN

## 2013-04-01 LAB — URINALYSIS, ROUTINE W REFLEX MICROSCOPIC
Bilirubin Urine: NEGATIVE
Hgb urine dipstick: NEGATIVE
Ketones, ur: NEGATIVE mg/dL
Specific Gravity, Urine: 1.022 (ref 1.005–1.030)
Urobilinogen, UA: 0.2 mg/dL (ref 0.0–1.0)
pH: 7.5 (ref 5.0–8.0)

## 2013-04-01 LAB — PREGNANCY, URINE: Preg Test, Ur: NEGATIVE

## 2013-04-01 LAB — GC/CHLAMYDIA PROBE AMP: CT Probe RNA: NEGATIVE

## 2013-04-01 LAB — RPR: RPR Ser Ql: NONREACTIVE

## 2013-04-01 LAB — URINE MICROSCOPIC-ADD ON

## 2013-04-01 MED ORDER — ACYCLOVIR 400 MG PO TABS: 400.0000 mg | ORAL_TABLET | Freq: Three times a day (TID) | ORAL | Status: AC

## 2013-04-01 MED ORDER — METRONIDAZOLE 500 MG PO TABS: 500.0000 mg | ORAL_TABLET | Freq: Two times a day (BID) | ORAL | Status: DC

## 2013-04-02 LAB — HERPES SIMPLEX VIRUS CULTURE: Culture: DETECTED

## 2013-04-02 LAB — URINE CULTURE

## 2013-04-03 NOTE — ED Provider Notes (Signed)
Medical screening examination/treatment/procedure(s) were performed by non-physician practitioner and as supervising physician I was immediately available for consultation/collaboration.    Lizandra Zakrzewski J. Karine Garn, MD 04/03/13 1527 

## 2016-02-13 ENCOUNTER — Emergency Department (HOSPITAL_COMMUNITY): Payer: Self-pay

## 2016-02-13 ENCOUNTER — Emergency Department (HOSPITAL_COMMUNITY)
Admission: EM | Admit: 2016-02-13 | Discharge: 2016-02-13 | Disposition: A | Discharge status: Home / Self Care | Payer: Self-pay | Attending: Emergency Medicine | Admitting: Emergency Medicine

## 2016-02-13 ENCOUNTER — Encounter (HOSPITAL_COMMUNITY): Payer: Self-pay | Admitting: Pharmacy Technician

## 2016-02-13 DIAGNOSIS — I10 Essential (primary) hypertension: Secondary | ICD-10-CM | POA: Insufficient documentation

## 2016-02-13 DIAGNOSIS — F17210 Nicotine dependence, cigarettes, uncomplicated: Secondary | ICD-10-CM | POA: Insufficient documentation

## 2016-02-13 DIAGNOSIS — W57XXXA Bitten or stung by nonvenomous insect and other nonvenomous arthropods, initial encounter: Secondary | ICD-10-CM

## 2016-02-13 DIAGNOSIS — Z760 Encounter for issue of repeat prescription: Secondary | ICD-10-CM | POA: Insufficient documentation

## 2016-02-13 DIAGNOSIS — L03113 Cellulitis of right upper limb: Secondary | ICD-10-CM | POA: Insufficient documentation

## 2016-02-13 LAB — BASIC METABOLIC PANEL
Anion gap: 6 (ref 5–15)
BUN: 12 mg/dL (ref 6–20)
CALCIUM: 9.2 mg/dL (ref 8.9–10.3)
CO2: 26 mmol/L (ref 22–32)
CREATININE: 0.77 mg/dL (ref 0.44–1.00)
Chloride: 107 mmol/L (ref 101–111)
GFR calc Af Amer: >60 mL/min (ref >60)
Glucose, Bld: 80 mg/dL (ref 65–99)
Potassium: 3.5 mmol/L (ref 3.5–5.1)
SODIUM: 139 mmol/L (ref 135–145)

## 2016-02-13 LAB — CBC WITH DIFFERENTIAL/PLATELET
BASOS ABS: 0 10*3/uL (ref 0.0–0.1)
BASOS PCT: 1 %
EOS ABS: 0.2 10*3/uL (ref 0.0–0.7)
EOS PCT: 4 %
HCT: 38.6 % (ref 36.0–46.0)
Hemoglobin: 12.5 g/dL (ref 12.0–15.0)
Lymphocytes Relative: 45 %
Lymphs Abs: 2.1 10*3/uL (ref 0.7–4.0)
MCH: 28.7 pg (ref 26.0–34.0)
MCHC: 32.4 g/dL (ref 30.0–36.0)
MCV: 88.7 fL (ref 78.0–100.0)
MONO ABS: 0.5 10*3/uL (ref 0.1–1.0)
Monocytes Relative: 11 %
Neutro Abs: 1.9 10*3/uL (ref 1.7–7.7)
Neutrophils Relative %: 39 %
Platelets: 271 10*3/uL (ref 150–400)
RBC: 4.35 MIL/uL (ref 3.87–5.11)
RDW: 12.3 % (ref 11.5–15.5)
WBC: 4.7 10*3/uL (ref 4.0–10.5)

## 2016-02-13 MED ORDER — CEPHALEXIN 500 MG PO CAPS: 500.0000 mg | ORAL_CAPSULE | Freq: Three times a day (TID) | ORAL | 0 refills | Status: DC

## 2016-02-13 MED ORDER — AMLODIPINE BESYLATE 5 MG PO TABS: 5.0000 mg | ORAL_TABLET | Freq: Every day | ORAL | 0 refills | Status: DC

## 2016-02-13 MED ORDER — HYDROXYZINE HCL 25 MG PO TABS
25.0000 mg | ORAL_TABLET | Freq: Once | ORAL | Status: AC
  Administered 2016-02-13: 25 mg via ORAL
  Filled 2016-02-13: qty 1

## 2016-02-13 MED ORDER — CEPHALEXIN 250 MG PO CAPS
500.0000 mg | ORAL_CAPSULE | Freq: Once | ORAL | Status: AC
  Administered 2016-02-13: 500 mg via ORAL
  Filled 2016-02-13: qty 2

## 2016-02-13 MED ORDER — HYDROXYZINE HCL 25 MG PO TABS: 25.0000 mg | ORAL_TABLET | Freq: Four times a day (QID) | ORAL | 0 refills | Status: DC | PRN

## 2016-02-13 MED ORDER — SULFAMETHOXAZOLE-TRIMETHOPRIM 800-160 MG PO TABS: 1.0000 | ORAL_TABLET | Freq: Two times a day (BID) | ORAL | 0 refills | Status: AC

## 2016-02-13 MED ORDER — SULFAMETHOXAZOLE-TRIMETHOPRIM 800-160 MG PO TABS
1.0000 | ORAL_TABLET | Freq: Once | ORAL | Status: AC
  Administered 2016-02-13: 1 via ORAL
  Filled 2016-02-13: qty 1

## 2016-02-13 NOTE — Discharge Instructions (Signed)
Take both bottles of antibiotics as prescribed until you finish both bottles. Take Vistaril as needed for itching. You may continue the cortisone cream if it is helping. I also gave you a one month refill of your norvasc. Please establish a primary care provider as soon as possible when you get back to [place]. Return to the ER for new or worsening symptoms.

## 2016-02-13 NOTE — ED Notes (Signed)
Patient transported to X-ray 

## 2016-02-13 NOTE — ED Provider Notes (Signed)
[facility]-EMERGENCY DEPT Provider Note   CSN: [id number] Arrival date & time: 02/13/16  1720   By signing my name below, I, Clovis Pu, attest that this documentation has been prepared under the direction and in the presence of  Gwenn Teodoro, [place]. Electronically Signed: Clovis Pu, ED Scribe. 02/13/16. 5:55 PM.   History   Chief Complaint Chief Complaint  Patient presents with  . Insect Bite     The history is provided by the patient. No language interpreter was used.    HPI Comments:  Amber Little is a 30 y.o. female who presents to the Emergency Department complaining of an insect bite to her right middle finger and forearm which occurred yesterday. Pt states the area of the bug bites are itchy but denies any pain. She has noticed redness near the bites and up the back side of her arm. She states she did not witness the incident and does not know what bit her. Pt denies any fevers, chills, recent camping, or travels. She has applied cortisone cream with mild relief. Pt notes no one else around her has similar insect bites.    Also requesting a refill of norvasc 5mg  daily. Has been out for one month. She lives near [place] and has been unable to establish primary care thus far as she has no insurance. Denies headache, blurred, vision, chest pain, SOB.  History reviewed. No pertinent past medical history.  There are no active problems to display for this patient.   History reviewed. No pertinent surgical history.  OB History    No data available       Home Medications    Prior to Admission medications   Medication Sig Start Date End Date Taking? Authorizing Provider  amLODipine (NORVASC) 5 MG tablet Take 5 mg by mouth daily.    Historical Provider, MD  metroNIDAZOLE (FLAGYL) 500 MG tablet Take 1 tablet (500 mg total) by mouth 2 (two) times daily. 04/01/13   Jillyn Ledger, PA-C  naproxen (NAPROSYN) 250 MG tablet Take 250 mg by mouth 2 (two) times daily as needed (PAIN).     Historical Provider, MD    Family History No family history on file.  Social History Social History  Substance Use Topics  . Smoking status: Current Some Day Smoker    Packs/day: 0.50    Types: Cigarettes  . Smokeless tobacco: Never Used  . Alcohol use Yes     Comment: rarely     Allergies   Review of patient's allergies indicates no known allergies.   Review of Systems Review of Systems 10 systems reviewed and all are negative for acute change except as noted in the HPI.    Physical Exam Updated Vital Signs BP (!) 169/105 (BP Location: Left Arm)   Pulse 63   Temp 97.3 F (36.3 C) (Oral)   Resp 15   LMP 02/11/2016   SpO2 100%   Physical Exam  Constitutional: She appears well-developed and well-nourished. No distress.  HENT:  Head: Normocephalic and atraumatic.  Neck: Neck supple.  Pulmonary/Chest: Effort normal.  Musculoskeletal: Normal range of motion.  Full ROM of R index finger   Neurological: She is alert.  Skin: She is not diaphoretic. There is erythema.  R middle finger with firm edematous nodule on ulnar aspect near PIP. Non tender, erythema that wraps around finger with a small streak up the dorsum of her hand. R posterior forearm with 1 cm firm erythematous nodule. Non tender surrounding erythema of skin with  a small area of streaking proximally. Areas have been marked with skin marker.  Nursing note and vitals reviewed.    ED Treatments / Results  DIAGNOSTIC STUDIES:  Oxygen Saturation is 100% on RA, normal by my interpretation.    COORDINATION OF CARE:  5:49 PM Discussed treatment plan with pt at bedside and pt agreed to plan.  Labs (all labs ordered are listed, but only abnormal results are displayed) Labs Reviewed  BASIC METABOLIC PANEL  CBC WITH DIFFERENTIAL/PLATELET    EKG  EKG Interpretation None       Radiology No results found.  Procedures Procedures (including critical care time)  Medications Ordered in  ED Medications - No data to display   Initial Impression / Assessment and Plan / ED Course  I have reviewed the triage vital signs and the nursing notes.  Pertinent labs & imaging results that were available during my care of the patient were reviewed by me and considered in my medical decision making (see chart for details).  Clinical Course    Labs unremarkable. Cellulitis marked with skin marker. Will cover with course of bactrim/keflex. Vistaril given as needed for itching. Will also give rx for norvasc but encouraged establishing local primary care as soon as possible. Pt returning to [place]. Strict ER return precautions given.   Final Clinical Impressions(s) / ED Diagnoses   Final diagnoses:  Infected insect bite  Cellulitis of right upper extremity  Encounter for medication refill  Essential hypertension    New Prescriptions Discharge Medication List as of 02/13/2016  6:37 PM    START taking these medications   Details  !! amLODipine (NORVASC) 5 MG tablet Take 1 tablet (5 mg total) by mouth daily., Starting Mon 02/13/2016, Print    cephALEXin (KEFLEX) 500 MG capsule Take 1 capsule (500 mg total) by mouth 3 (three) times daily., Starting Mon 02/13/2016, Print    hydrOXYzine (ATARAX/VISTARIL) 25 MG tablet Take 1 tablet (25 mg total) by mouth every 6 (six) hours as needed for itching., Starting Mon 02/13/2016, Print    sulfamethoxazole-trimethoprim (BACTRIM DS,SEPTRA DS) 800-160 MG tablet Take 1 tablet by mouth 2 (two) times daily., Starting Mon 02/13/2016, Until Mon 02/20/2016, Print     !! - Potential duplicate medications found. Please discuss with provider.     I personally performed the services described in this documentation, which was scribed in my presence. The recorded information has been reviewed and is accurate.    Carlene CoriaSerena Y [place], PA-C 02/14/16 [id number]    Shaune Pollackameron Isaacs, MD 02/14/16 1322

## 2016-02-13 NOTE — ED Triage Notes (Signed)
Patient arrives to the ed with complaints of an insect bite on her R hand and Forearm on yesterday. Swelling and redness are present. Denies any fever/chills.

## 2016-05-06 ENCOUNTER — Encounter (HOSPITAL_COMMUNITY): Payer: Self-pay

## 2016-05-06 ENCOUNTER — Emergency Department (HOSPITAL_COMMUNITY)
Admission: EM | Admit: 2016-05-06 | Discharge: 2016-05-06 | Disposition: A | Discharge status: Home / Self Care | Payer: Self-pay | Attending: Emergency Medicine | Admitting: Emergency Medicine

## 2016-05-06 DIAGNOSIS — S50362A Insect bite (nonvenomous) of left elbow, initial encounter: Secondary | ICD-10-CM | POA: Insufficient documentation

## 2016-05-06 DIAGNOSIS — Y999 Unspecified external cause status: Secondary | ICD-10-CM | POA: Insufficient documentation

## 2016-05-06 DIAGNOSIS — Y929 Unspecified place or not applicable: Secondary | ICD-10-CM | POA: Insufficient documentation

## 2016-05-06 DIAGNOSIS — F17210 Nicotine dependence, cigarettes, uncomplicated: Secondary | ICD-10-CM | POA: Insufficient documentation

## 2016-05-06 DIAGNOSIS — Y939 Activity, unspecified: Secondary | ICD-10-CM | POA: Insufficient documentation

## 2016-05-06 DIAGNOSIS — I10 Essential (primary) hypertension: Secondary | ICD-10-CM | POA: Insufficient documentation

## 2016-05-06 DIAGNOSIS — W57XXXA Bitten or stung by nonvenomous insect and other nonvenomous arthropods, initial encounter: Secondary | ICD-10-CM | POA: Insufficient documentation

## 2016-05-06 MED ORDER — AMLODIPINE BESYLATE 5 MG PO TABS: 5.0000 mg | ORAL_TABLET | Freq: Every day | ORAL | 1 refills | Status: DC

## 2016-05-06 NOTE — Discharge Instructions (Signed)
Do not hesitate to return to the emergency room for any new, worsening or concerning symptoms. ° °Please obtain primary care using resource guide below. Let them know that you were seen in the emergency room and that they will need to obtain records for further outpatient management. ° ° °

## 2016-05-06 NOTE — ED Triage Notes (Signed)
Patient here with possible insect bite to right elbow/forearm x 2 days, no pain but complains of itching. Used cortisone cream with no relief

## 2016-05-06 NOTE — ED Provider Notes (Signed)
[facility]-EMERGENCY DEPT Provider Note   CSN: [id number] Arrival date & time: 05/06/16  1643     History   Chief Complaint No chief complaint on file.   HPI  Blood pressure 155/96, pulse 67, temperature 98.6 F (37 C), temperature source Oral, resp. rate 12, height 5\' 3"  (1.6 m), weight 53.7 kg, SpO2 100 %.  Amber Little is a 30 y.o. female complaining of reticulocyte area to right elbow which she noticed 2 days ago, initially it was quite red but now that has improved. She denies pain, fever, chills, reduced range of motion in the elbow, similar sick contacts. She is also requesting a refill on her Norvasc, she's been out of it for a week. She denies chest pain, headache, nausea vomiting. She does not have a primary care physician because she is uninsured.  History reviewed. No pertinent past medical history.  There are no active problems to display for this patient.   History reviewed. No pertinent surgical history.  OB History    No data available       Home Medications    Prior to Admission medications   Medication Sig Start Date End Date Taking? Authorizing Provider  amLODipine (NORVASC) 5 MG tablet Take 1 tablet (5 mg total) by mouth daily. 05/06/16   Collene Massimino, PA-C  cephALEXin (KEFLEX) 500 MG capsule Take 1 capsule (500 mg total) by mouth 3 (three) times daily. 02/13/16   Ace GinsSerena Y Sam, PA-C  hydrOXYzine (ATARAX/VISTARIL) 25 MG tablet Take 1 tablet (25 mg total) by mouth every 6 (six) hours as needed for itching. 02/13/16   Ace GinsSerena Y Sam, PA-C  metroNIDAZOLE (FLAGYL) 500 MG tablet Take 1 tablet (500 mg total) by mouth 2 (two) times daily. 04/01/13   Jillyn LedgerJessica K Palmer, PA-C  naproxen (NAPROSYN) 250 MG tablet Take 250 mg by mouth 2 (two) times daily as needed (PAIN).    Historical Provider, MD    Family History No family history on file.  Social History Social History  Substance Use Topics  . Smoking status: Current Some Day Smoker    Packs/day: 0.50    Types:  Cigarettes  . Smokeless tobacco: Never Used  . Alcohol use Yes     Comment: rarely     Allergies   Patient has no known allergies.   Review of Systems Review of Systems   Physical Exam Updated Vital Signs BP 151/95 (BP Location: Right Arm)   Pulse 67   Temp 98.5 F (36.9 C) (Oral)   Resp 18   Ht 5\' 3"  (1.6 m)   Wt 53.7 kg   SpO2 100%   BMI 20.96 kg/m   Physical Exam  Constitutional: She is oriented to person, place, and time. She appears well-developed and well-nourished. No distress.  HENT:  Head: Normocephalic and atraumatic.  Mouth/Throat: Oropharynx is clear and moist.  Eyes: Conjunctivae and EOM are normal. Pupils are equal, round, and reactive to light.  Neck: Normal range of motion.  Cardiovascular: Normal rate, regular rhythm and intact distal pulses.   Pulmonary/Chest: Effort normal and breath sounds normal.  Abdominal: Soft. There is no tenderness.  Musculoskeletal: Normal range of motion.  Neurological: She is alert and oriented to person, place, and time.  Skin: Rash noted. She is not diaphoretic.  2 small bug bites with mild surrounding erythema to left elbow, no warmth or induration, full range of motion to elbow, distally neurovascularly intact.  Psychiatric: She has a normal mood and affect.  Nursing note and  vitals reviewed.    ED Treatments / Results  Labs (all labs ordered are listed, but only abnormal results are displayed) Labs Reviewed - No data to display  EKG  EKG Interpretation None       Radiology No results found.  Procedures Procedures (including critical care time)  Medications Ordered in ED Medications - No data to display   Initial Impression / Assessment and Plan / ED Course  I have reviewed the triage vital signs and the nursing notes.  Pertinent labs & imaging results that were available during my care of the patient were reviewed by me and considered in my medical decision making (see chart for  details).  Clinical Course     Vitals:   05/06/16 1703 05/06/16 1704 05/06/16 1809  BP: 155/96  151/95  Pulse: 67  67  Resp: 12  18  Temp: 98.6 F (37 C)  98.5 F (36.9 C)  TempSrc: Oral  Oral  SpO2: 100%  100%  Weight:  53.7 kg   Height:  5\' 3"  (1.6 m)     Amber Little is 30 y.o. female presenting with Insect bite to left elbow, she's also been out of her high blood pressure medication for a week, blood pressure is mildly elevated but she is asymptomatic. Refill given for Norvasc and case management consult to help her establish primary care.  Evaluation does not show pathology that would require ongoing emergent intervention or inpatient treatment. Pt is hemodynamically stable and mentating appropriately. Discussed findings and plan with patient/guardian, who agrees with care plan. All questions answered. Return precautions discussed and outpatient follow up given.      Final Clinical Impressions(s) / ED Diagnoses   Final diagnoses:  Hypertension, unspecified type  Insect bite, initial encounter    New Prescriptions Discharge Medication List as of 05/06/2016  6:07 PM       Wynetta EmeryNicole Benjamyn Hestand, PA-C 05/07/16 0148    Mancel BaleElliott Wentz, MD 05/09/16 1012

## 2016-05-06 NOTE — ED Notes (Signed)
Pt is in stable condition upon d/c and ambulates from ED. 

## 2016-05-07 ENCOUNTER — Telehealth: Payer: Self-pay | Admitting: *Deleted

## 2016-06-30 ENCOUNTER — Encounter (HOSPITAL_COMMUNITY): Payer: Self-pay | Admitting: Emergency Medicine

## 2016-06-30 ENCOUNTER — Emergency Department (HOSPITAL_COMMUNITY)
Admission: EM | Admit: 2016-06-30 | Discharge: 2016-06-30 | Disposition: A | Discharge status: Home / Self Care | Payer: Self-pay | Attending: Emergency Medicine | Admitting: Emergency Medicine

## 2016-06-30 DIAGNOSIS — R110 Nausea: Secondary | ICD-10-CM | POA: Insufficient documentation

## 2016-06-30 DIAGNOSIS — Z79899 Other long term (current) drug therapy: Secondary | ICD-10-CM | POA: Insufficient documentation

## 2016-06-30 DIAGNOSIS — F17210 Nicotine dependence, cigarettes, uncomplicated: Secondary | ICD-10-CM | POA: Insufficient documentation

## 2016-06-30 MED ORDER — ONDANSETRON HCL 4 MG PO TABS: 4.0000 mg | ORAL_TABLET | Freq: Three times a day (TID) | ORAL | 0 refills | Status: DC | PRN

## 2016-06-30 NOTE — ED Provider Notes (Signed)
[facility]-EMERGENCY DEPT Provider Note   CSN: [id number] Arrival date & time: 06/30/16  1325   By signing my name below, I, [name], attest that this documentation has been prepared under the direction and in the presence of Mathews Robinsons, PA-C. Electronically Signed: [name], Scribe. 06/30/16. 6:08 PM.   History   Chief Complaint Chief Complaint  Patient presents with  . Nausea   The history is provided by the patient and medical records. No language interpreter was used.    HPI Comments: Amber Little is a 31 y.o. female who presents to the Emergency Department complaining of new onset, gradually improving nausea x 2 days. She reports associated headache. Triage notes pt took a pregnancy test which came back negative, and she wants to r/o the flu. She states she has treated her symptoms with soup, ginger ale, and ibuprofen with adequate relief. Noted Hx of HTN controlled with medication. Pt denies fever/chills, abdominal pain, sore throat, body aches, vomiting, diarrhea, cough, congestion or rhinorrhea. Pt not vaccinated for the flu this season. No PCP.  History reviewed. No pertinent past medical history.  There are no active problems to display for this patient.   History reviewed. No pertinent surgical history.  OB History    No data available       Home Medications    Prior to Admission medications   Medication Sig Start Date End Date Taking? Authorizing Provider  amLODipine (NORVASC) 5 MG tablet Take 1 tablet (5 mg total) by mouth daily. 05/06/16   Nicole Pisciotta, PA-C  cephALEXin (KEFLEX) 500 MG capsule Take 1 capsule (500 mg total) by mouth 3 (three) times daily. 02/13/16   Ace Gins Sam, PA-C  hydrOXYzine (ATARAX/VISTARIL) 25 MG tablet Take 1 tablet (25 mg total) by mouth every 6 (six) hours as needed for itching. 02/13/16   Ace Gins Sam, PA-C  metroNIDAZOLE (FLAGYL) 500 MG tablet Take 1 tablet (500 mg total) by mouth 2 (two) times daily. 04/01/13   Jillyn Ledger, PA-C  naproxen (NAPROSYN) 250 MG tablet Take 250 mg by mouth 2 (two) times daily as needed (PAIN).    Historical Provider, MD  ondansetron (ZOFRAN) 4 MG tablet Take 1 tablet (4 mg total) by mouth every 8 (eight) hours as needed for nausea or vomiting. 06/30/16   Georgiana Shore, PA-C    Family History History reviewed. No pertinent family history.  Social History Social History  Substance Use Topics  . Smoking status: Current Some Day Smoker    Packs/day: 0.50    Types: Cigarettes  . Smokeless tobacco: Never Used  . Alcohol use Yes     Comment: rarely     Allergies   Patient has no known allergies.   Review of Systems Review of Systems  Constitutional: Negative for chills and fever.  HENT: Negative for congestion, postnasal drip, rhinorrhea and sore throat.   Respiratory: Negative for cough and shortness of breath.   Cardiovascular: Negative for chest pain.  Gastrointestinal: Positive for nausea. Negative for abdominal pain, constipation, diarrhea and vomiting.  Genitourinary: Negative for dysuria, hematuria, vaginal bleeding and vaginal discharge.  Musculoskeletal: Negative for arthralgias and myalgias.  Skin: Negative for color change.  Allergic/Immunologic: Negative for immunocompromised state.  Neurological: Negative for weakness and numbness.  Psychiatric/Behavioral: Negative for confusion.     Physical Exam Updated Vital Signs BP (!) 127/108 (BP Location: Left Arm) Comment: Pt takes Norvasc but has been out for about 1 week  Pulse 71   Temp  98.4 F (36.9 C) (Oral)   Resp 16   Ht 5\' 3"  (1.6 m)   Wt 120 lb (54.4 kg)   LMP 05/04/2016   SpO2 100%   BMI 21.26 kg/m   Physical Exam  Constitutional: She is oriented to person, place, and time. Vital signs are normal. She appears well-developed and well-nourished.  Non-toxic appearance. No distress.  Afebrile, nontoxic, NAD  HENT:  Head: Normocephalic and atraumatic.  Right Ear: External ear normal.    Left Ear: External ear normal.  Nose: Nose normal.  Mouth/Throat: Oropharynx is clear and moist and mucous membranes are normal.  Eyes: Conjunctivae and EOM are normal. Pupils are equal, round, and reactive to light. Right eye exhibits no discharge. Left eye exhibits no discharge.  Neck: Normal range of motion. Neck supple.  Cardiovascular: Normal rate, regular rhythm, normal heart sounds and intact distal pulses.   Pulmonary/Chest: Effort normal and breath sounds normal. No respiratory distress. She has no wheezes. She has no rales. She exhibits no tenderness.  Abdominal: Soft. Normal appearance. She exhibits no distension. There is no tenderness. There is no rebound and no guarding.  Musculoskeletal: Normal range of motion. She exhibits no tenderness.  Lymphadenopathy:    She has no cervical adenopathy.  Neurological: She is alert and oriented to person, place, and time. She has normal strength. No sensory deficit.  Skin: Skin is warm, dry and intact. No rash noted. She is not diaphoretic.  Psychiatric: She has a normal mood and affect. Her behavior is normal.  Nursing note and vitals reviewed.    ED Treatments / Results  DIAGNOSTIC STUDIES: Oxygen Saturation is 100% on RA, normal by my interpretation.    COORDINATION OF CARE: 6:05 PM Discussed treatment plan with pt at bedside and pt agreed to plan. Will order medication for nausea. Pt advised to continue preventative care at home, [facility] symptoms worsen and F/U with PCP for management of HTN.  Labs (all labs ordered are listed, but only abnormal results are displayed) Labs Reviewed - No data to display  EKG  EKG Interpretation None       Radiology No results found.  Procedures Procedures (including critical care time)  Medications Ordered in ED Medications - No data to display   Initial Impression / Assessment and Plan / ED Course  I have reviewed the triage vital signs and the nursing  notes.  Pertinent labs & imaging results that were available during my care of the patient were reviewed by me and considered in my medical decision making (see chart for details).     31 y/o female presenting with nausea yesterday which has now subsided. She currently denies any symptoms, but wanted to be evaluated  given the current flu situation and multiple sick family members.  Reassuring exam. Unremarkable. Lungs are clear and equal bilaterally. No abdominal pain or tenderness. She is asymtomatic, afebrile, non-toxic appearing.  Discharge home with symptomatic relief of nausea and follow up with PCP. Patient was provided with resources to establish with Primary care. Discussed HTN and patient currently has medications but will run out. She will be making an appointment on Monday to follow up.  Discussed strict return precautions. Patient was advised to return to the emergency department if experiencing any new or worsening symptoms. She understood instructions and agreed with discharge plan.  I personally performed the services described in this documentation, which was scribed in my presence. The recorded information has been reviewed and is accurate.  Final Clinical Impressions(s) / ED Diagnoses   Final diagnoses:  Nausea    New Prescriptions Discharge Medication List as of 06/30/2016  6:17 PM    START taking these medications   Details  ondansetron (ZOFRAN) 4 MG tablet Take 1 tablet (4 mg total) by mouth every 8 (eight) hours as needed for nausea or vomiting., Starting Sat 06/30/2016, Print         Georgiana Shore, PA-C 07/01/16 0046    Bethann Berkshire, MD 07/01/16 2238

## 2016-06-30 NOTE — ED Triage Notes (Signed)
Pt states that she had some nausea start yesterday that is better today.  She took a home pregnancy test that was negative but wants to be sure it is not the flu.  Pt has had no fever.

## 2016-08-02 DIAGNOSIS — R87619 Unspecified abnormal cytological findings in specimens from cervix uteri: Secondary | ICD-10-CM

## 2016-08-02 HISTORY — DX: Unspecified abnormal cytological findings in specimens from cervix uteri: R87.619

## 2016-08-07 ENCOUNTER — Ambulatory Visit (INDEPENDENT_AMBULATORY_CARE_PROVIDER_SITE_OTHER): Payer: Self-pay | Admitting: Internal Medicine

## 2016-08-07 DIAGNOSIS — R8789 Other abnormal findings in specimens from female genital organs: Secondary | ICD-10-CM

## 2016-08-07 DIAGNOSIS — Z0000 Encounter for general adult medical examination without abnormal findings: Secondary | ICD-10-CM | POA: Insufficient documentation

## 2016-08-07 DIAGNOSIS — Z1151 Encounter for screening for human papillomavirus (HPV): Secondary | ICD-10-CM

## 2016-08-07 DIAGNOSIS — Z01411 Encounter for gynecological examination (general) (routine) with abnormal findings: Secondary | ICD-10-CM

## 2016-08-07 DIAGNOSIS — I10 Essential (primary) hypertension: Secondary | ICD-10-CM

## 2016-08-07 DIAGNOSIS — Z79899 Other long term (current) drug therapy: Secondary | ICD-10-CM

## 2016-08-07 DIAGNOSIS — R87618 Other abnormal cytological findings on specimens from cervix uteri: Secondary | ICD-10-CM

## 2016-08-07 DIAGNOSIS — Z87891 Personal history of nicotine dependence: Secondary | ICD-10-CM

## 2016-08-07 HISTORY — DX: Essential (primary) hypertension: I10

## 2016-08-07 MED ORDER — AMLODIPINE BESYLATE 10 MG PO TABS: 10.0000 mg | ORAL_TABLET | Freq: Every day | ORAL | 2 refills | Status: DC

## 2016-08-07 NOTE — Progress Notes (Signed)
   CC: establish care as a new patient and discuss HTN  HPI:  [name] is a 31 y.o.  With hx of HTN.   No PCP recently. Usually goes to the ED with problems. Med hx only is HTN. No other medical problems in the past other than occasional headache. She takes amlodipine 5mg  daily for her HTN. Did not take it today as she was rushing.   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 She is sexually active with boyfriend, does not use any contraceptive. No hx of STD.   No complaints today such as no CP, sob, cough, fever, bleeding. Has regular periods, last LMP 1 week ago. No dysuria, no abdominal or vaginal symptoms.  She is a former smoker (smoked on and off for 2 years), occasionally drinks alcohol. No drug use.   Past Medical History:  Diagnosis Date  . Essential hypertension 08/07/2016   Family History  Problem Relation Age of Onset  . Diabetes Mother   . Hypertension Mother   . Hypertension Father   . Hypertension Paternal Grandmother   . Cancer Neg Hx    History reviewed. No pertinent surgical history.   Last PAP smear 02/2011 was normal. Wants to do pap today.    Review of Systems:   See HPI.  Physical Exam:  Vitals:   08/07/16 1517  BP: (!) 160/88  Pulse: (!) 54  Temp: 98.2 F (36.8 C)  TempSrc: Oral  SpO2: 100%  Weight: 120 lb (54.4 kg)  Height: 5\' 3"  (1.6 m)   Physical Exam  Constitutional: She is oriented to person, place, and time. She appears well-developed and well-nourished. No distress.  HENT:  Head: Normocephalic and atraumatic.  Eyes: Conjunctivae are normal. Right eye exhibits no discharge. Left eye exhibits no discharge.  Cardiovascular: Normal rate and regular rhythm.  Exam reveals no gallop and no friction rub.   No murmur heard. Respiratory: Effort normal and breath sounds normal. No respiratory distress. She has no wheezes.  GI: Soft. Bowel sounds are normal. She exhibits no  distension. There is no tenderness.  Genitourinary: There is no rash, tenderness or injury on the right labia. There is no rash, tenderness or injury on the left labia. No signs of injury around the vagina. No vaginal discharge found.  Genitourinary Comments: Performed PAP smear. Small amount of bleeding with the procedure. No discharge noted, no erythema. No mass or cervical tenderness on exam.   Neurological: She is alert and oriented to person, place, and time. No cranial nerve deficit.  Skin: Skin is warm. She is not diaphoretic.  Psychiatric: She has a normal mood and affect.     Assessment & Plan:   See Encounters Tab for problem based charting.  Patient discussed with Dr. Heide SparkNarendra

## 2016-08-07 NOTE — Assessment & Plan Note (Signed)
Performed PAP smear today.

## 2016-08-07 NOTE — Patient Instructions (Signed)
It was nice to meet you.  I will increase your amlodipine today.  Check your BP at home.   Follow up in 3 months for recheck.

## 2016-08-07 NOTE — Assessment & Plan Note (Signed)
Vitals:   08/07/16 1517  BP: (!) 160/88  Pulse: (!) 54  Temp: 98.2 F (36.8 C)   BP is elevated, on amlodipine 5mg  daily but has not taken it today. Will increase to 10mg  daily. Asked her to check BP at home. Already had labwork recently in the ED which was unremarkable.  F/up in 3 months for recheck.

## 2016-08-09 ENCOUNTER — Ambulatory Visit: Payer: Self-pay

## 2016-08-09 DIAGNOSIS — R87618 Other abnormal cytological findings on specimens from cervix uteri: Secondary | ICD-10-CM | POA: Insufficient documentation

## 2016-08-09 DIAGNOSIS — R8789 Other abnormal findings in specimens from female genital organs: Secondary | ICD-10-CM | POA: Insufficient documentation

## 2016-08-09 LAB — CYTOLOGY - PAP
DIAGNOSIS: NEGATIVE
HPV (WINDOPATH): DETECTED — AB
HPV 16/18/45 genotyping: POSITIVE — AB

## 2016-08-09 NOTE — Addendum Note (Signed)
Addended by: Amber Little, Amber Little on: 08/09/2016 05:03 PM   Modules accepted: Orders

## 2016-08-09 NOTE — Progress Notes (Signed)
Internal Medicine Clinic Attending  Case discussed with Dr. Ahmed at the time of the visit.  We reviewed the resident's history and exam and pertinent patient test results.  I agree with the assessment, diagnosis, and plan of care documented in the resident's note. 

## 2016-08-09 NOTE — Assessment & Plan Note (Signed)
Pap showed +HPV. Cytology was normal. Will do DNA testing for HPV and if high risk will consider colposcopy, otherwise will do co-testing in 1 year.

## 2016-08-09 NOTE — Addendum Note (Signed)
Addended by: Hyacinth MeekerAHMED, Kalah Pflum on: 08/09/2016 04:58 PM   Modules accepted: Orders

## 2016-08-14 NOTE — Addendum Note (Signed)
Addended by: Hyacinth MeekerAHMED, Tamkia Temples on: 08/14/2016 12:48 PM   Modules accepted: Orders

## 2016-08-15 ENCOUNTER — Telehealth: Payer: Self-pay | Admitting: Internal Medicine

## 2016-08-15 NOTE — Telephone Encounter (Signed)
TALKED WITH PATIENT, SHE NEEDS TO GET SOME PAPER WORK FROM HER MOTHER, SHOULD HAVE BY END OF THIS WEEK AND SHE WILL CALL FOR APT TO DO HER [facility] APP

## 2016-08-20 ENCOUNTER — Ambulatory Visit: Payer: Self-pay

## 2016-10-03 ENCOUNTER — Ambulatory Visit: Payer: Self-pay | Admitting: Obstetrics & Gynecology

## 2016-10-08 ENCOUNTER — Telehealth: Payer: Self-pay | Admitting: Internal Medicine

## 2016-10-08 NOTE — Telephone Encounter (Signed)
CALLED PT. NO ANSWER NO VOICEMAIL, NEEDS TO DO GCCN APP

## 2016-10-16 ENCOUNTER — Encounter: Payer: Self-pay | Admitting: Internal Medicine

## 2016-11-13 ENCOUNTER — Encounter: Payer: Self-pay | Admitting: Internal Medicine

## 2016-11-21 ENCOUNTER — Ambulatory Visit: Payer: Self-pay

## 2016-11-28 ENCOUNTER — Ambulatory Visit: Payer: Self-pay

## 2016-11-29 ENCOUNTER — Encounter: Payer: Self-pay | Admitting: Internal Medicine

## 2017-02-25 ENCOUNTER — Ambulatory Visit (INDEPENDENT_AMBULATORY_CARE_PROVIDER_SITE_OTHER): Payer: Self-pay | Admitting: Internal Medicine

## 2017-02-25 ENCOUNTER — Encounter (INDEPENDENT_AMBULATORY_CARE_PROVIDER_SITE_OTHER): Payer: Self-pay

## 2017-02-25 VITALS — BP 151/92 | HR 57 | Ht 64.0 in | Wt 121.8 lb

## 2017-02-25 DIAGNOSIS — Z0000 Encounter for general adult medical examination without abnormal findings: Secondary | ICD-10-CM

## 2017-02-25 DIAGNOSIS — Z87891 Personal history of nicotine dependence: Secondary | ICD-10-CM

## 2017-02-25 DIAGNOSIS — I10 Essential (primary) hypertension: Secondary | ICD-10-CM

## 2017-02-25 MED ORDER — HYDROCHLOROTHIAZIDE 12.5 MG PO CAPS: 12.5000 mg | ORAL_CAPSULE | Freq: Every day | ORAL | 11 refills | Status: DC

## 2017-02-25 NOTE — Progress Notes (Signed)
   CC: Hypertension  HPI:  [name] is a 31 y.o. female with a past medical history of hypertension presenting for hypertension follow up. She was last seen in clinic in March 2018. At that time she was on 5 mg of Amlodipine and her blood pressure was still elevated, so her Amlodipine was increased to 10 mg daily. She states that after her dose was increased she started having chest pain and nausea. She states she has intermittent reflux/heartburn and that her chest pain felt similar to reflux symptoms, but was not going away with Zantec and was constant. It was at that time she stopped taking the Amlodipine and states that her chest pain and nausea resolved. She has been off of her amlodipine for about 1 month and has not had chest pain or nausea since stopping the medication. She is willing to try a different blood pressure medication.   Past Medical History:  Diagnosis Date  . Essential hypertension 08/07/2016   Review of Systems:   Review of Systems  Constitutional: Negative for chills and fever.  Eyes: Negative for blurred vision and double vision.  Respiratory: Negative for shortness of breath.   Cardiovascular: Negative for chest pain and leg swelling.  Gastrointestinal: Negative for nausea.  Neurological: Negative for headaches.    Physical Exam:  Vitals:   02/25/17 1052  BP: (!) 176/100  Pulse: (!) 57  SpO2: 99%  Weight: 121 lb 12.8 oz (55.2 kg)  Height:  (1.626 m)   General: Sitting comfortably, NAD HEENT: [place]/AT, EOMI, no scleral icterus Cardiac: RRR, No R/M/G appreciated Pulm: normal effort, CTAB Abd: soft, non tender, non distended, BS normal Ext: extremities well perfused, no peripheral edema Neuro: alert and oriented X3, cranial nerves II-XII grossly intact   Assessment & Plan:   See Encounters Tab for problem based charting.  Patient seen with Dr. Heide Spark

## 2017-02-25 NOTE — Assessment & Plan Note (Signed)
Patient declined Flu vaccine 02/25/2017.

## 2017-02-25 NOTE — Patient Instructions (Addendum)
Ms. Marcy,   It was a pleasure meeting you today.   I have started you on a new blood pressure medication called Hydrochlorothiazide 12.5 mg. Take 1 tablet daily.   Please call the clinic if you have any side effects to this medication.  Return to clinic in 1 month for a blood pressure check and lab work.

## 2017-02-25 NOTE — Assessment & Plan Note (Signed)
BP Readings from Last 3 Encounters:  02/25/17 (!) 151/92  08/07/16 (!) 160/88  06/30/16 114/79  Elevated, not well controlled off medication. BP 172/100 and repeat was 151/92. Most recent BMET 02/2016. Creatinine 0.77 and electrolytes within normal limits.   Plan: -Start Hydrochlorothiazide 12.5 mg daily -BMET today  -RTC in 1 month for BP check and repeat BMET -Advised patient to call clinic if she experiences any side effects

## 2017-02-25 NOTE — Progress Notes (Deleted)
   CC: ***  HPI:  [name] is a 31 y.o.   Past Medical History:  Diagnosis Date  . Essential hypertension 08/07/2016   Review of Systems:  ***  Physical Exam:  Vitals:   02/25/17 1052  BP: (!) 176/100  Pulse: (!) 57  SpO2: 99%  Weight: 121 lb 12.8 oz (55.2 kg)  Height:  (1.626 m)   ROS  Assessment & Plan:   See Encounters Tab for problem based charting.  Patient {GC/GE:[id number]::"discussed with","seen with"} Dr. {NAMES:[id number]::"Butcher","Granfortuna","E. Hoffman","Klima","Mullen","Narendra","Raines","Vincent"}

## 2017-02-26 LAB — BMP8+ANION GAP
Anion Gap: 14 mmol/L (ref 10.0–18.0)
BUN/Creatinine Ratio: 10 (ref 9–23)
BUN: 8 mg/dL (ref 6–20)
CHLORIDE: 103 mmol/L (ref 96–106)
CO2: 22 mmol/L (ref 20–29)
Calcium: 9.4 mg/dL (ref 8.7–10.2)
Creatinine, Ser: 0.78 mg/dL (ref 0.57–1.00)
GFR calc Af Amer: 117 mL/min/{1.73_m2} (ref >59)
GFR calc non Af Amer: 102 mL/min/{1.73_m2} (ref >59)
GLUCOSE: 91 mg/dL (ref 65–99)
Potassium: 4.1 mmol/L (ref 3.5–5.2)
SODIUM: 139 mmol/L (ref 134–144)

## 2017-03-04 NOTE — Progress Notes (Signed)
Internal Medicine Clinic Attending  I saw and evaluated the patient.  I personally confirmed the key portions of the history and exam documented by Dr. LaCroce and I reviewed pertinent patient test results.  The assessment, diagnosis, and plan were formulated together and I agree with the documentation in the resident's note.  

## 2017-04-16 ENCOUNTER — Encounter (INDEPENDENT_AMBULATORY_CARE_PROVIDER_SITE_OTHER): Payer: Self-pay

## 2017-04-16 ENCOUNTER — Ambulatory Visit: Payer: Self-pay

## 2017-07-02 NOTE — Progress Notes (Signed)
   CC: follow-up of +HPV pap  HPI:  [name] is a 32 y.o. 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 No atypical cells were noted on prior PAP and she was referred to OB/GYN for colposcopy. Patient has history of coloposcopy but was >1 year ago. She does not think she has seen GYN since 08/2016. She has no complaints and has noticed no discharge, changes in weight, pelvic pain or bloating.    Past Medical History:  Diagnosis Date  . Essential hypertension 08/07/2016   Review of Systems:   General: Denies fevers, chills, weight loss, fatigue Abd: Denies abdominal pain, bloating GU: Denies dysuria, pelvic pain, bloating, dyspareunia or discharge  Physical Exam: General: Alert, in no acute distress. Pleasant and conversant HEENT: No icterus, injection or ptosis. No hoarseness or dysarthria  Cardiac: RRR, no MGR appreciated Pulmonary: CTA BL with normal WOB on RA. Able to speak in complete sentences Abd: Soft, non-tender. +bs Extremities: Warm, perfused. No significant pedal edema.    Vitals:   07/03/17 1351 07/03/17 1352  BP: (!) 155/106 138/82  Pulse: 68 70  Temp: 98 F (36.7 C)   TempSrc: Oral   SpO2: 100%   Weight: 121 lb 14.4 oz (55.3 kg)   Height: 5\' 4"  (1.626 m)    Assessment & Plan:   See Encounters Tab for problem based charting.  Patient discussed with Dr. Rogelia BogaButcher

## 2017-07-03 ENCOUNTER — Other Ambulatory Visit: Payer: Self-pay

## 2017-07-03 ENCOUNTER — Encounter: Payer: Self-pay | Admitting: Internal Medicine

## 2017-07-03 ENCOUNTER — Ambulatory Visit (INDEPENDENT_AMBULATORY_CARE_PROVIDER_SITE_OTHER): Payer: Self-pay | Admitting: Internal Medicine

## 2017-07-03 VITALS — BP 138/82 | HR 70 | Temp 98.0°F | Ht 64.0 in | Wt 121.9 lb

## 2017-07-03 DIAGNOSIS — R8789 Other abnormal findings in specimens from female genital organs: Secondary | ICD-10-CM

## 2017-07-03 DIAGNOSIS — R87618 Other abnormal cytological findings on specimens from cervix uteri: Secondary | ICD-10-CM

## 2017-07-04 ENCOUNTER — Encounter: Payer: Self-pay | Admitting: Internal Medicine

## 2017-07-04 NOTE — Assessment & Plan Note (Signed)
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 Referred to GYN for colposcopy and I see communication from GYN requesting this to be scheduled soon after the appointment. Pt tells me she hasnt had one in >1 yr. 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 She denies any pelvic pain, pelvic fullness, dyspareunia or discharge. She asks to be referred to GYN for repeat PAP and colposcopy which I dont find unreasonable given her history of repeated positive tests and prior colposcopy with unclear intervention. I did offer to perform PAP today however wants to be seen by GYN.  -Refer to GYN

## 2017-07-05 NOTE — Progress Notes (Signed)
Internal Medicine Clinic Attending  Case discussed with Dr. Vincente LibertyMolt at the time of the visit.  We reviewed the resident's history and exam and pertinent patient test results.  I agree with the assessment, diagnosis, and plan of care documented in the resident's note. PT referred to gyn for high risk HPV in 2018. Pt no showed her 10/2016 gyn appt. No record of colposcopy in Epic. Gyn referral.

## 2017-07-10 ENCOUNTER — Telehealth: Payer: Self-pay | Admitting: *Deleted

## 2017-10-07 ENCOUNTER — Ambulatory Visit: Payer: Self-pay

## 2017-10-07 ENCOUNTER — Encounter (INDEPENDENT_AMBULATORY_CARE_PROVIDER_SITE_OTHER): Payer: Self-pay

## 2017-10-08 ENCOUNTER — Ambulatory Visit: Payer: Self-pay

## 2017-10-08 ENCOUNTER — Ambulatory Visit (INDEPENDENT_AMBULATORY_CARE_PROVIDER_SITE_OTHER): Payer: Self-pay | Admitting: Internal Medicine

## 2017-10-08 ENCOUNTER — Other Ambulatory Visit: Payer: Self-pay

## 2017-10-08 VITALS — BP 159/99 | HR 74 | Temp 98.2°F | Wt 122.3 lb

## 2017-10-08 DIAGNOSIS — Z79899 Other long term (current) drug therapy: Secondary | ICD-10-CM

## 2017-10-08 DIAGNOSIS — R8789 Other abnormal findings in specimens from female genital organs: Secondary | ICD-10-CM

## 2017-10-08 DIAGNOSIS — R05 Cough: Secondary | ICD-10-CM

## 2017-10-08 DIAGNOSIS — M25511 Pain in right shoulder: Secondary | ICD-10-CM

## 2017-10-08 DIAGNOSIS — G8911 Acute pain due to trauma: Secondary | ICD-10-CM

## 2017-10-08 DIAGNOSIS — I10 Essential (primary) hypertension: Secondary | ICD-10-CM

## 2017-10-08 DIAGNOSIS — M25519 Pain in unspecified shoulder: Secondary | ICD-10-CM

## 2017-10-08 DIAGNOSIS — R059 Cough, unspecified: Secondary | ICD-10-CM

## 2017-10-08 DIAGNOSIS — R093 Abnormal sputum: Secondary | ICD-10-CM

## 2017-10-08 DIAGNOSIS — R87618 Other abnormal cytological findings on specimens from cervix uteri: Secondary | ICD-10-CM

## 2017-10-08 MED ORDER — HYDROCHLOROTHIAZIDE 25 MG PO TABS: 25.0000 mg | ORAL_TABLET | Freq: Every day | ORAL | 0 refills | Status: DC

## 2017-10-08 NOTE — Patient Instructions (Addendum)
FOLLOW-UP INSTRUCTIONS When: two weeks  For: for a BMP and BP check What to bring: Your medications   Thank you for your visit to [facility] today.  I have increased your HCTZ to 25 mg daily to better control your blood pressure.  Please return in 2 weeks for a lab and blood pressure check.  Please be sure to complete the repeat PAP smear testing in July With Dr. Vincente Liberty given the results of the abnormal finding on the prior exam on December 31, 2017. If you would like to stop at the front desk to inquire as to an early available time please feel free to do so.   With regard to the cough, if you develop fever, cough up blood, chest pain, persistent symptoms lasting greater than 8 weeks, or any other concerning factor please notify us or visit the emergency department.
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
## 2017-10-09 DIAGNOSIS — R059 Cough, unspecified: Secondary | ICD-10-CM | POA: Insufficient documentation

## 2017-10-09 DIAGNOSIS — R05 Cough: Secondary | ICD-10-CM | POA: Insufficient documentation

## 2017-10-09 DIAGNOSIS — M25519 Pain in unspecified shoulder: Secondary | ICD-10-CM | POA: Insufficient documentation

## 2017-10-09 NOTE — Progress Notes (Signed)
Internal Medicine Clinic Attending  Case discussed with Dr. Harbrecht at the time of the visit.  We reviewed the resident's history and exam and pertinent patient test results.  I agree with the assessment, diagnosis, and plan of care documented in the resident's note.   

## 2017-10-09 NOTE — Assessment & Plan Note (Signed)
Cough: 1.5 week history of a cough with occasional whitish sputum. She self treats to some success with guaifenesin, and benadryl.  Patient denied fever, chills, nausea, vomiting, diarrhea, hemoptysis, chest pain, abdominal pain, myalgias, dyspnea, visual changes, headache, sinus pain, ear pain or pharyngitis.  She attested to cough, sputum, nasal drainage only.  Plan: Recommended that she continue the guaifenesin, benadryl (being cautious not to drive with this on board), and utilize motrin at times for mild subjective fever, myalgias or specific pain.  She is to return if her cough markedly worsens, she develops additional concerning symptoms such as fever > ~100.4, chills, myalgias.

## 2017-10-09 NOTE — Assessment & Plan Note (Signed)
HTN: BP chronically elevated. She did not previously tolerate CCB's.  Plan: I will increase her HCTZ today to   BMP in two weeks and dose adjustment or additional medication for her BP as indicated.

## 2017-10-09 NOTE — Assessment & Plan Note (Signed)
Abnormal PAP smear results: Patient stated that she is to follow-up with her PCP Dr. Vincente Liberty in July for her abnormal pap. She would prefer to defer this until then.
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
## 2017-10-29 ENCOUNTER — Encounter (HOSPITAL_COMMUNITY): Payer: Self-pay | Admitting: Emergency Medicine

## 2017-10-29 ENCOUNTER — Emergency Department (HOSPITAL_COMMUNITY): Payer: Self-pay

## 2017-10-29 ENCOUNTER — Other Ambulatory Visit: Payer: Self-pay

## 2017-10-29 ENCOUNTER — Emergency Department (HOSPITAL_COMMUNITY)
Admission: EM | Admit: 2017-10-29 | Discharge: 2017-10-29 | Disposition: A | Discharge status: Home / Self Care | Payer: Self-pay | Attending: Emergency Medicine | Admitting: Emergency Medicine

## 2017-10-29 DIAGNOSIS — R0789 Other chest pain: Secondary | ICD-10-CM | POA: Insufficient documentation

## 2017-10-29 DIAGNOSIS — I10 Essential (primary) hypertension: Secondary | ICD-10-CM | POA: Insufficient documentation

## 2017-10-29 DIAGNOSIS — Z87891 Personal history of nicotine dependence: Secondary | ICD-10-CM | POA: Insufficient documentation

## 2017-10-29 DIAGNOSIS — Z79899 Other long term (current) drug therapy: Secondary | ICD-10-CM | POA: Insufficient documentation

## 2017-10-29 LAB — CBC
HEMATOCRIT: 38.8 % (ref 36.0–46.0)
Hemoglobin: 12.7 g/dL (ref 12.0–15.0)
MCH: 29.1 pg (ref 26.0–34.0)
MCHC: 32.7 g/dL (ref 30.0–36.0)
MCV: 88.8 fL (ref 78.0–100.0)
Platelets: 256 10*3/uL (ref 150–400)
RBC: 4.37 MIL/uL (ref 3.87–5.11)
RDW: 12.3 % (ref 11.5–15.5)
WBC: 6.5 10*3/uL (ref 4.0–10.5)

## 2017-10-29 LAB — BASIC METABOLIC PANEL
Anion gap: 11 (ref 5–15)
BUN: 13 mg/dL (ref 6–20)
CO2: 26 mmol/L (ref 22–32)
Calcium: 9.3 mg/dL (ref 8.9–10.3)
Chloride: 102 mmol/L (ref 101–111)
Creatinine, Ser: 0.81 mg/dL (ref 0.44–1.00)
GFR calc Af Amer: >60 mL/min (ref >60)
Glucose, Bld: 100 mg/dL — ABNORMAL HIGH (ref 65–99)
POTASSIUM: 3.7 mmol/L (ref 3.5–5.1)
Sodium: 139 mmol/L (ref 135–145)

## 2017-10-29 LAB — I-STAT BETA HCG BLOOD, ED (MC, WL, AP ONLY): I-stat hCG, quantitative: <5 m[IU]/mL (ref <5)

## 2017-10-29 LAB — I-STAT TROPONIN, ED: Troponin i, poc: 0.01 ng/mL (ref 0.00–0.08)

## 2017-10-29 NOTE — Discharge Instructions (Signed)
Please take over the counter medication for heart burn.  If your chest pressure or abnormal sensation returns then please seek additional medical care and evaluation.  Please follow up with your primary care doctor in the next 1-2 days for a repeat examination and blood pressure check.

## 2017-10-29 NOTE — ED Notes (Signed)
Patient complaining of sob and left chest pain that started last night. Patient states she did not take anything for it. Patient states she is not having any pain right now.

## 2017-10-29 NOTE — ED Provider Notes (Signed)
[facility]-EMERGENCY DEPT Provider Note   CSN: [id number] Arrival date & time: 10/29/17  1222     History   Chief Complaint No chief complaint on file.   HPI Amber Little is a 32 y.o. female who presents today for evaluation of left chest pressure that occurred last night.  She reports that yesterday she ate a burger, salad, spicy foods including barbecue ranch chips with bacon.  She reports that then immediately after eating she laid down and started having chest pressure.  She reports it felt like bubbles in her stomach and her pain was made better with burping and sitting up.  She reports the pressure made it hurt to move her left side and was better when she leaned to the right side.  She denies any weakness, was not dropping any objects.  Her friend in room reports that she had no facial droop, no slurred speech and no leg weakness during this.  She denies any cardiac history.  She does take aleve almost daily.    She reports that currently she feels fully back to her baseline, has eaten this morning with out difficulty, has no pain or pressure, feels fine now and all symptoms are fully resolved, just "wants to be checked out."  She denies estrogen use, no recent surgeries or leg swelling, no hemoptysis.  HPI  Past Medical History:  Diagnosis Date  . Essential hypertension 08/07/2016    Patient Active Problem List   Diagnosis Date Noted  . Shoulder pain, acute 10/09/2017  . Cough 10/09/2017  . 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 Essential hypertension 08/07/2016  . Healthcare maintenance 08/07/2016    History reviewed. No pertinent surgical history.   OB History   None      Home Medications    Prior to Admission medications   Medication Sig Start Date End Date Taking? Authorizing Provider  hydrochlorothiazide (HYDRODIURIL) 25 MG tablet Take 1 tablet (25 mg total) by mouth daily. 10/08/17   Lanelle Bal, MD    Family History Family History  Problem Relation Age of Onset  . Diabetes Mother   . Hypertension Mother   . Hypertension Father   . Hypertension Paternal Grandmother   . Cancer Neg Hx     Social History Social History   Tobacco Use  . Smoking status: Former Smoker    Packs/day: 0.50    Types: Cigarettes  . Smokeless tobacco: Never Used  Substance Use Topics  . Alcohol use: Yes    Comment: rarely  . Drug use: No     Allergies   Patient has no known allergies.   Review of Systems Review of Systems  Constitutional: Negative for chills and fever.  HENT: Negative for ear pain and sore throat.   Eyes: Negative for pain and visual disturbance.  Respiratory: Negative for cough and shortness of breath.   Cardiovascular: Negative for chest pain and palpitations.       Chest pressure has fully resolved  Gastrointestinal: Negative for abdominal pain and vomiting.  Genitourinary: Negative for dysuria and hematuria.  Musculoskeletal: Negative for arthralgias and back pain.  Skin: Negative for color change and rash.  Neurological: Negative for seizures and syncope.  All other systems reviewed and are negative.    Physical Exam Updated Vital Signs BP (!) 148/97 (BP Location: Left Arm)   Pulse 76   Temp 98.1 F (36.7 C) (Oral)   Resp 15   Ht  (  1.6 m)   Wt 56.7 kg (125 lb)   LMP 10/15/2017   SpO2 100%   BMI 22.14 kg/m   Physical Exam  Constitutional: She is oriented to person, place, and time. She appears well-developed and well-nourished. No distress.  HENT:  Head: Normocephalic and atraumatic.  Eyes: Conjunctivae are normal.  Neck: Normal range of motion. Neck supple. No JVD present. No tracheal deviation present.  Cardiovascular: Normal rate, regular rhythm, normal heart sounds and intact distal pulses.  No murmur heard. No swelling to bilateral lower legs.  Pulmonary/Chest: Effort normal and breath sounds normal. No respiratory distress.  She exhibits no tenderness.  Abdominal: Soft. Bowel sounds are normal. She exhibits no distension. There is no tenderness. There is no guarding.  Musculoskeletal: She exhibits no edema.  Neurological: She is alert and oriented to person, place, and time.  5/5 strength in bilateral upper and lower extremities.  Face is symmetrical, speech is not slurred, CN 2-12 intact.     Skin: Skin is warm and dry. She is not diaphoretic.  Psychiatric: She has a normal mood and affect.  Nursing note and vitals reviewed.    ED Treatments / Results  Labs (all labs ordered are listed, but only abnormal results are displayed) Labs Reviewed  BASIC METABOLIC PANEL - Abnormal; Notable for the following components:      Result Value   Glucose, Bld 100 (*)    All other components within normal limits  CBC  I-STAT TROPONIN, ED  I-STAT BETA HCG BLOOD, ED ([facility], [facility], [facility] ONLY)    EKG EKG Interpretation  Date/Time:  Tuesday Oct 29 2017 12:43:49 EDT Ventricular Rate:  88 PR Interval:    QRS Duration: 75 QT Interval:  354 QTC Calculation: 429 R Axis:   73 Text Interpretation:  Sinus rhythm Borderline short PR interval No old tracing to compare Confirmed by Mancel Bale [phone]) on 10/29/2017 2:45:46 PM   Radiology Dg Chest 2 View  Result Date: 10/29/2017 CLINICAL DATA:  Chest tightness and dyspnea EXAM: CHEST - 2 VIEW COMPARISON:  02/08/2008 chest radiograph. FINDINGS: Stable cardiomediastinal silhouette with normal heart size. No pneumothorax. No pleural effusion. Lungs appear clear, with no acute consolidative airspace disease and no pulmonary edema. IMPRESSION: No active cardiopulmonary disease. Electronically Signed   By: Delbert Phenix M.D.   On: 10/29/2017 13:47    Procedures Procedures (including critical care time)  Medications Ordered in ED Medications - No data to display   Initial Impression / Assessment and Plan / ED Course  I have reviewed the triage vital signs and the nursing  notes.  Pertinent labs & imaging results that were available during my care of the patient were reviewed by me and considered in my medical decision making (see chart for details).    Patient is to be discharged with recommendation to follow up with PCP in regards to today's hospital visit. Chest pain is not likely of cardiac or pulmonary etiology d/t presentation, PERC negative, VSS, no tracheal deviation, no JVD or new murmur, RRR, breath sounds equal bilaterally, EKG without acute abnormalities, negative troponin, and negative CXR. Heart score 1.  Pt has been advised to return to the ED if CP returns or she has any recurrence of symptoms.  Only one trop obtained given that her symptoms were over 6 hours ago and has fully resolved.  Also instructed to return if CP becomes exertional, associated with diaphoresis or nausea, radiates to left jaw/arm, worsens or becomes concerning in any way. Pt  appears reliable for follow up and is agreeable to discharge.  Suspect GERD based on history of spicy foods then laying down.  Recommended dietary changes, remaining upright after eating and OTC antacids.      Final Clinical Impressions(s) / ED Diagnoses   Final diagnoses:  Sensation of chest pressure    ED Discharge Orders    None       Norman Clay 10/29/17 1655    Loren Racer, MD 10/29/17 2213

## 2017-10-29 NOTE — ED Triage Notes (Addendum)
Per pt, SOB with chest tightness since last night. Tightness accompanied by left arm pain and weakness and numbess. Patient states this happened after eating a lot and laying down. Says she feels better after she burps. Says she's experienced some palpitations with it. Denies LOC or cardiac hx.

## 2017-10-29 NOTE — ED Notes (Signed)
Pt states she has not taken BP medication today. ?

## 2017-11-15 ENCOUNTER — Telehealth: Payer: Self-pay | Admitting: Internal Medicine

## 2017-11-27 ENCOUNTER — Ambulatory Visit (INDEPENDENT_AMBULATORY_CARE_PROVIDER_SITE_OTHER): Payer: Self-pay | Admitting: Internal Medicine

## 2017-11-27 ENCOUNTER — Encounter: Payer: Self-pay | Admitting: Internal Medicine

## 2017-11-27 ENCOUNTER — Other Ambulatory Visit: Payer: Self-pay

## 2017-11-27 VITALS — BP 129/83 | HR 58 | Temp 98.9°F | Ht 64.0 in | Wt 125.5 lb

## 2017-11-27 DIAGNOSIS — Z87891 Personal history of nicotine dependence: Secondary | ICD-10-CM | POA: Insufficient documentation

## 2017-11-27 DIAGNOSIS — Z8619 Personal history of other infectious and parasitic diseases: Secondary | ICD-10-CM

## 2017-11-27 DIAGNOSIS — K219 Gastro-esophageal reflux disease without esophagitis: Secondary | ICD-10-CM

## 2017-11-27 DIAGNOSIS — Z791 Long term (current) use of non-steroidal anti-inflammatories (NSAID): Secondary | ICD-10-CM

## 2017-11-27 DIAGNOSIS — I10 Essential (primary) hypertension: Secondary | ICD-10-CM

## 2017-11-27 DIAGNOSIS — Z79899 Other long term (current) drug therapy: Secondary | ICD-10-CM

## 2017-11-27 HISTORY — DX: Gastro-esophageal reflux disease without esophagitis: K21.9

## 2017-11-27 MED ORDER — HYDROCHLOROTHIAZIDE 25 MG PO TABS: 25.0000 mg | ORAL_TABLET | Freq: Every day | ORAL | 1 refills | Status: DC

## 2017-11-27 NOTE — Progress Notes (Signed)
Medicine attending: Medical history, presenting problems, physical findings, and medications, reviewed with resident physician Dr Bethany Molt on the day of the patient visit and I concur with her evaluation and management plan. 

## 2017-11-27 NOTE — Assessment & Plan Note (Addendum)
Assessment:  BP 139/95 with pulse 51 which improved on repeat. Patient states she feels much better since increasing this medication. No dizziness, lightheadedness or muscle cramping. Was seen in ED 2361-month ago and renal function/lytes normal.      Plan: Continue HCTZ 25mg  daily. No need for repeat BMET today given normal renal function several weeks after dose increase. RTC 6 months

## 2017-11-27 NOTE — Patient Instructions (Addendum)
It was nice seeing you today. Thank you for choosing [facility] for your Primary Care.   Today we talked about:  1) High Blood Pressure: Your blood pressure is much better on the increased dose of the HCTZ. Please continue this. I've sent in a 735-month supply to your pharmacy.   FOLLOW-UP INSTRUCTIONS When: 6 months For: BP What to bring: Meds  Please contact the clinic if you have any problems, or need to be seen sooner.

## 2017-11-27 NOTE — Progress Notes (Signed)
   CC: follow-up of HTN  HPI:  [name] is a 32 y.o. 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 She has no acute complaints. Of note, she was seen in the Emergency Department about 1 month ago for chest pain attributed to GERD. She's had no recurrent symptoms since altering her diet.   For details regarding today's visit and the status of their chronic medical issues, please refer to the assessment and plan.  Past Medical History:  Diagnosis Date  . Abnormal Pap smear of cervix 08/2016   High risk HPV positive but cytology normal  . Essential hypertension 08/07/2016   Review of Systems:   General: Denies fevers, chills, weight loss HEENT: Denies changes in vision, sore throat, cough Cardiac: Denies CP, SOB, palpitations Pulmonary: Denies cough, wheezes, PND Abd: Denies abdominal pain, changes in bowels Extremities: Denies weakness or swelling  Physical Exam: General: Alert, in no acute distress. Pleasant and conversant HEENT: No icterus, injection or ptosis. No hoarseness or dysarthria  Cardiac: RRR, no MGR appreciated Pulmonary: CTA BL with normal WOB on RA. Able to speak in complete sentences Abd: Soft, non-tender. +bs Extremities: Warm, perfused. No significant pedal edema.   Vitals:   11/27/17 1339 11/27/17 1340 11/27/17 1348  BP: (!) 139/95  129/83  Pulse: (!) 51  (!) 58  Temp: 98.9 F (37.2 C)    TempSrc: Oral    Weight: 125 lb 8 oz (56.9 kg)    Height:  5\' 4"  (1.626 m)    Body mass index is 21.54 kg/m.  Assessment & Plan:   See Encounters Tab for problem based charting.  Patient discussed with Dr. Cyndie ChimeGranfortuna

## 2017-11-27 NOTE — Assessment & Plan Note (Signed)
Assessment:  Seen in ED 10/30/17 with brief episode of chest discomfort after eating spicy foods and a burger at a BBQ then laying down. By time of evaluation her chest discomfort had resolved. Work-up negative for ACS including normal EKG and normal troponin. Pt does take NSAIDs.      Plan: Discussed diet today. Symptoms better controlled with dietary changes. Discussed addition of prns vs PPI today, elected to continue with dietary modifications for now.

## 2017-12-31 ENCOUNTER — Encounter: Payer: Self-pay | Admitting: Internal Medicine

## 2018-03-24 ENCOUNTER — Encounter: Payer: Self-pay | Admitting: *Deleted

## 2018-06-12 ENCOUNTER — Emergency Department (HOSPITAL_COMMUNITY)
Admission: EM | Admit: 2018-06-12 | Discharge: 2018-06-12 | Disposition: A | Discharge status: Home / Self Care | Payer: Self-pay | Attending: Emergency Medicine | Admitting: Emergency Medicine

## 2018-06-12 ENCOUNTER — Other Ambulatory Visit: Payer: Self-pay

## 2018-06-12 ENCOUNTER — Encounter (HOSPITAL_COMMUNITY): Payer: Self-pay

## 2018-06-12 DIAGNOSIS — Z79899 Other long term (current) drug therapy: Secondary | ICD-10-CM | POA: Insufficient documentation

## 2018-06-12 DIAGNOSIS — Z87891 Personal history of nicotine dependence: Secondary | ICD-10-CM | POA: Insufficient documentation

## 2018-06-12 DIAGNOSIS — K047 Periapical abscess without sinus: Secondary | ICD-10-CM | POA: Insufficient documentation

## 2018-06-12 DIAGNOSIS — I10 Essential (primary) hypertension: Secondary | ICD-10-CM | POA: Insufficient documentation

## 2018-06-12 MED ORDER — AMOXICILLIN 500 MG PO CAPS: 500.0000 mg | ORAL_CAPSULE | Freq: Three times a day (TID) | ORAL | 0 refills | Status: DC

## 2018-06-12 MED ORDER — IBUPROFEN 600 MG PO TABS: 600.0000 mg | ORAL_TABLET | Freq: Four times a day (QID) | ORAL | 0 refills | Status: DC | PRN

## 2018-06-12 NOTE — Discharge Instructions (Addendum)
Return if any problems.

## 2018-06-12 NOTE — ED Provider Notes (Signed)
[facility] EMERGENCY DEPARTMENT Provider Note   CSN: [id number] Arrival date & time: 06/12/18  1145     History   Chief Complaint Chief Complaint  Patient presents with  . Dental Pain    HPI Amber Little is a 33 y.o. female.  The history is provided by the patient. No language interpreter was used.  Dental Pain  Location:  Lower Lower teeth location:  17/LL 3rd molar Quality:  Aching Severity:  Moderate Onset quality:  Gradual Timing:  Constant Progression:  Worsening Chronicity:  New Relieved by:  Nothing Worsened by:  Nothing Associated symptoms: no facial swelling     Past Medical History:  Diagnosis Date  . 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 Gyn rec'd 1 yr PAP + HPV cotesting  . Essential hypertension 08/07/2016  . History of tobacco use     Patient Active Problem List   Diagnosis Date Noted  . GERD (gastroesophageal reflux disease) 11/27/2017  . History of tobacco use   . Shoulder pain, acute 10/09/2017  . 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 Essential hypertension 08/07/2016  . Healthcare maintenance 08/07/2016    History reviewed. No pertinent surgical history.   OB History   No obstetric history on file.      Home Medications    Prior to Admission medications   Medication Sig Start Date End Date Taking? Authorizing Provider  amoxicillin (AMOXIL) 500 MG capsule Take 1 capsule (500 mg total) by mouth 3 (three) times daily. 06/12/18   Elson Areas, PA-C  hydrochlorothiazide (HYDRODIURIL) 25 MG tablet Take 1 tablet (25 mg total) by mouth daily. 11/27/17   Molt, Bethany, DO  ibuprofen (ADVIL,MOTRIN) 600 MG tablet Take 1 tablet (600 mg total) by mouth every 6 (six) hours as needed. 06/12/18   Elson Areas, PA-C    Family History Family History  Problem Relation Age of Onset  . Diabetes Mother   . Hypertension Mother   .  Hypertension Father   . Hypertension Paternal Grandmother   . Cancer Neg Hx     Social History Social History   Tobacco Use  . Smoking status: Former Smoker    Packs/day: 0.50    Types: Cigarettes  . Smokeless tobacco: Never Used  Substance Use Topics  . Alcohol use: Yes    Comment: rarely  . Drug use: No     Allergies   Patient has no known allergies.   Review of Systems Review of Systems  HENT: Negative for facial swelling.   All other systems reviewed and are negative.    Physical Exam Updated Vital Signs BP (!) 169/113 (BP Location: Left Arm)   Pulse (!) 55   Temp 98.5 F (36.9 C) (Oral)   Resp 16   LMP 06/12/2018 (Exact Date)   SpO2 94%   Physical Exam Vitals signs and nursing note reviewed.  Constitutional:      Appearance: Normal appearance.  HENT:     Head: Normocephalic.     Nose: Nose normal.  Eyes:     Pupils: Pupils are equal, round, and reactive to light.  Neck:     Musculoskeletal: Normal range of motion.  Cardiovascular:     Rate and Rhythm: Normal rate.  Pulmonary:     Effort: Pulmonary effort is normal.  Skin:    General: Skin is warm.  Neurological:     General: No focal deficit present.  Mental Status: She is alert.  Psychiatric:        Mood and Affect: Mood normal.      ED Treatments / Results  Labs (all labs ordered are listed, but only abnormal results are displayed) Labs Reviewed - No data to display  EKG None  Radiology No results found.  Procedures Procedures (including critical care time)  Medications Ordered in ED Medications - No data to display   Initial Impression / Assessment and Plan / ED Course  I have reviewed the triage vital signs and the nursing notes.  Pertinent labs & imaging results that were available during my care of the patient were reviewed by me and considered in my medical decision making (see chart for details).     MDM Pt advised she needs to see a dentist for evaluation    Final Clinical Impressions(s) / ED Diagnoses   Final diagnoses:  Dental infection    ED Discharge Orders         Ordered    amoxicillin (AMOXIL) 500 MG capsule  3 times daily     06/12/18 1243    ibuprofen (ADVIL,MOTRIN) 600 MG tablet  Every 6 hours PRN     06/12/18 1243        An After Visit Summary was printed and given to the patient.    Elson Areas, PA-C 06/12/18 1501    Alvira Monday, MD 06/13/18 1025

## 2018-06-12 NOTE — ED Notes (Signed)
Declined W/C at D/C and was escorted to lobby by RN. 

## 2018-06-12 NOTE — ED Triage Notes (Signed)
Pt endorses left lower dental pain x 3 days. Does not have a Education officer, community.

## 2018-12-31 ENCOUNTER — Ambulatory Visit: Payer: Self-pay | Admitting: Internal Medicine

## 2018-12-31 ENCOUNTER — Encounter: Payer: Self-pay | Admitting: Internal Medicine

## 2018-12-31 ENCOUNTER — Other Ambulatory Visit (HOSPITAL_COMMUNITY)
Admission: RE | Admit: 2018-12-31 | Discharge: 2018-12-31 | Disposition: A | Discharge status: Home / Self Care | Payer: Self-pay | Source: Ambulatory Visit | Attending: Internal Medicine | Admitting: Internal Medicine

## 2018-12-31 ENCOUNTER — Encounter (INDEPENDENT_AMBULATORY_CARE_PROVIDER_SITE_OTHER): Payer: Self-pay

## 2018-12-31 ENCOUNTER — Other Ambulatory Visit: Payer: Self-pay

## 2018-12-31 VITALS — BP 159/106 | HR 108 | Temp 99.2°F | Wt 122.0 lb

## 2018-12-31 DIAGNOSIS — R87810 Cervical high risk human papillomavirus (HPV) DNA test positive: Secondary | ICD-10-CM

## 2018-12-31 DIAGNOSIS — N923 Ovulation bleeding: Secondary | ICD-10-CM

## 2018-12-31 DIAGNOSIS — Z79899 Other long term (current) drug therapy: Secondary | ICD-10-CM

## 2018-12-31 DIAGNOSIS — Z113 Encounter for screening for infections with a predominantly sexual mode of transmission: Secondary | ICD-10-CM

## 2018-12-31 DIAGNOSIS — R87618 Other abnormal cytological findings on specimens from cervix uteri: Secondary | ICD-10-CM

## 2018-12-31 DIAGNOSIS — R8789 Other abnormal findings in specimens from female genital organs: Secondary | ICD-10-CM

## 2018-12-31 DIAGNOSIS — I10 Essential (primary) hypertension: Secondary | ICD-10-CM

## 2018-12-31 HISTORY — DX: Ovulation bleeding: N92.3

## 2018-12-31 NOTE — Assessment & Plan Note (Addendum)
BP Readings from Last 3 Encounters:  12/31/18 (!) 159/106  06/12/18 (!) 169/113  11/27/17 129/83   She has been taking her HCTZ 25 mg qd but intermittently. She checks her blood pressure at home and sometimes it is normal so she decides not to take her medication. Her blood pressure has been elevated since she first presented to clinic in 2018 on amlodipine with a few appointments with normal readings.  For her age and level of hypertension I think it may be prudent to rule out secondary causes of her hypertension.   -  Discussed that she needs to take HCTZ 25 mg consistently in order for blood pressure to remain consistently controlled. She endorses understanding and agreement  - f/u one week for blood pressure check  - will order renal artery [place] at follow-up for possible fibromuscular dysplasia.  - BMP

## 2018-12-31 NOTE — Progress Notes (Signed)
   CC: intermenstrual bleeding  HPI:  Amber Little is a 33 y.o. with PMH as below presenting with occasional intermenstrual bleeding. She states this has been happening off and on for the past several months with a small amount of spotting in between her periods. Her periods have otherwise been normal for her, without increased cramping. She is sexually active and monogamous. She is not on birth control and they do not routinely use protection. 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 She has no discharge, itching, pain, or odor that she has noticed. She denies nausea, abdominal pain or dysuria. She denies dizziness, numbness or shortness of breath.   Please see A&P for assessment of the patient's acute and chronic medical conditions.   Past Medical History:  Diagnosis Date  . 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 Gyn rec'd 1 yr PAP + HPV cotesting  . Essential hypertension 08/07/2016  . History of tobacco use    Review of Systems:   ROS negative except as noted in HPI.   Physical Exam:  Constitution: NAD, healthy appearing female  Cardio: RRR, no m/r/g; no LE edema   Respiratory: CTA, no w/r/r  GU: no lesions, discharge, bleeding or strong odor Skin: c/d/i    Vitals:   12/31/18 1341  BP: (!) 159/106  Pulse: (!) 108  Temp: 99.2 F (37.3 C)  TempSrc: Oral  SpO2: 98%  Weight: 122 lb (55.3 kg)     Assessment & Plan:   See Encounters Tab for problem based charting.  Patient discussed with Dr. Evette Doffing

## 2018-12-31 NOTE — Patient Instructions (Signed)
Thank you for allowing [place] to provide your care today. Today we discussed your HPV and Pap Smear testing and Hypertension  I have ordered the following labs for you:  PAP smear with HPV testing  Basic metabolic panel  STI testing    I will call if any are abnormal.    Today we made the following changes to your medications:   Please continue taking your hydrochlorothiazide as directed.    Please follow-up in six months for BMP or if symptoms worsen or fail to improve.   I have replaced a referral with gynecology. Someone will call you to make an appointment.    Should you have any questions or concerns please call the internal medicine clinic at [phone].

## 2018-12-31 NOTE — Assessment & Plan Note (Addendum)
  She is requesting pap smear today. 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 Follow-up was planned with referral to gynecology but she was subsequently lost to follow-up. She presents today with intermenstrual bleeding without other associated symptoms. No history of STI, but she is interested in STI screenign as she and her partner do not use protection.   - PAP smear with HPV testing - referral to gynecology placed  - GC/chlamydia urine; trich with pap, HIV screening  ADDENDUM: Repeat pap smear normal. Discussed with patient that she has cleared infection and will have her follow-up in one year for pap.

## 2019-01-01 ENCOUNTER — Encounter: Payer: Self-pay | Admitting: *Deleted

## 2019-01-01 LAB — HIV ANTIBODY (ROUTINE TESTING W REFLEX): HIV Screen 4th Generation wRfx: NONREACTIVE

## 2019-01-01 LAB — BMP8+ANION GAP
Anion Gap: 18 mmol/L (ref 10.0–18.0)
BUN/Creatinine Ratio: 13 (ref 9–23)
BUN: 11 mg/dL (ref 6–20)
CO2: 23 mmol/L (ref 20–29)
Calcium: 9.3 mg/dL (ref 8.7–10.2)
Chloride: 97 mmol/L (ref 96–106)
Creatinine, Ser: 0.82 mg/dL (ref 0.57–1.00)
GFR calc Af Amer: 109 mL/min/{1.73_m2} (ref >59)
GFR calc non Af Amer: 94 mL/min/{1.73_m2} (ref >59)
Glucose: 92 mg/dL (ref 65–99)
Potassium: 3.8 mmol/L (ref 3.5–5.2)
Sodium: 138 mmol/L (ref 134–144)

## 2019-01-01 NOTE — Progress Notes (Signed)
Internal Medicine Clinic Attending  Case discussed with Dr. Seawell at the time of the visit.  We reviewed the resident's history and exam and pertinent patient test results.  I agree with the assessment, diagnosis, and plan of care documented in the resident's note.    

## 2019-01-02 LAB — URINE CYTOLOGY ANCILLARY ONLY
Chlamydia: NEGATIVE
Neisseria Gonorrhea: NEGATIVE

## 2019-01-05 LAB — CYTOLOGY - PAP
Diagnosis: NEGATIVE
HPV: NOT DETECTED
Trichomonas: POSITIVE — AB

## 2019-01-07 ENCOUNTER — Encounter: Payer: Self-pay | Admitting: Internal Medicine

## 2019-01-07 ENCOUNTER — Other Ambulatory Visit: Payer: Self-pay | Admitting: Internal Medicine

## 2019-01-07 DIAGNOSIS — A599 Trichomoniasis, unspecified: Secondary | ICD-10-CM | POA: Insufficient documentation

## 2019-01-07 MED ORDER — METRONIDAZOLE 500 MG PO TABS: 500.0000 mg | ORAL_TABLET | Freq: Two times a day (BID) | ORAL | 0 refills | Status: DC

## 2019-01-07 NOTE — Assessment & Plan Note (Signed)
Positive for trichomoniasis. She has been asymptomatic without noticeable discharge, pruritis or itching. Called and gave patient results as she cancelled her follow-up appointment today due to work.   - flagyl 500 mg bid 7 days  - discussed that she should notify partner that he needs to be treated

## 2019-01-07 NOTE — Progress Notes (Deleted)
   CC: ***  HPI:  [name] is a 33 y.o. with PMH as below.   Please see A&P for assessment of the patient's acute and chronic medical conditions.    Trichomonas  Recent pap positive for trichomonas. She has not had symptoms or noticeable discharge.   -   Past Medical History:  Diagnosis Date  . 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 Gyn rec'd 1 yr PAP + HPV cotesting  . Essential hypertension 08/07/2016  . History of tobacco use    Review of Systems:  ***  Physical Exam:  Constitution: HENT: Eyes: Cardio: Respiratory: Abdominal: MSK: GU: Neuro: Skin:   There were no vitals filed for this visit. ***  Assessment & Plan:    See Encounters Tab for problem based charting.  Patient {GC/GE:3044014::"discussed with","seen with"} Dr. {NAMES:[id number]::"Butcher","Granfortuna","E. Hoffman","Klima","Mullen","Narendra","Raines","Vincent"}

## 2019-01-08 ENCOUNTER — Telehealth: Payer: Self-pay | Admitting: Internal Medicine

## 2019-01-08 NOTE — Telephone Encounter (Signed)
Pls contact pt [phone]

## 2019-01-08 NOTE — Telephone Encounter (Signed)
Pt calls with concerns, she states her partner wants to speak w/ the physician r/t pt's diag. They will be together after 4:30pm today and would like dr seawell to call [phone] try to between 4:45 and 5:30

## 2019-01-08 NOTE — Telephone Encounter (Signed)
Thanks Bonnita Nasuti, I will give them a call then. Thanks for sending the page as well.

## 2019-01-08 NOTE — Telephone Encounter (Signed)
rtc lm for rtc 

## 2019-01-14 ENCOUNTER — Ambulatory Visit: Payer: Self-pay

## 2019-01-19 ENCOUNTER — Encounter: Payer: Self-pay | Admitting: Internal Medicine

## 2019-01-19 ENCOUNTER — Ambulatory Visit: Payer: Self-pay

## 2019-01-21 ENCOUNTER — Encounter: Payer: Self-pay | Admitting: Internal Medicine

## 2019-01-21 NOTE — Progress Notes (Deleted)
   CC: ***  HPI:  [name] is a 33 y.o. with PMH as below.   Please see A&P for assessment of the patient's acute and chronic medical conditions.   Hypertension  She has been taking her HCTZ consistently. Blood pressure ***.   - renal artery [place]   Trichomonas Completed metro for trichomonas.   Past Medical History:  Diagnosis Date  . 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 Gyn rec'd 1 yr PAP + HPV cotesting  . Essential hypertension 08/07/2016  . History of tobacco use    Review of Systems:  ***  Physical Exam:  Constitution: HENT: Eyes: Cardio: Respiratory: Abdominal: MSK: GU: Neuro: Skin:   There were no vitals filed for this visit. ***  Assessment & Plan:   See Encounters Tab for problem based charting.  Patient {GC/GE:3044014::"discussed with","seen with"} Dr. {NAMES:[id number]::"Butcher","Granfortuna","E. Hoffman","Klima","Mullen","Narendra","Raines","Vincent"}

## 2019-01-28 ENCOUNTER — Encounter: Payer: Self-pay | Admitting: Internal Medicine

## 2019-01-28 ENCOUNTER — Telehealth: Payer: Self-pay | Admitting: *Deleted

## 2019-01-28 ENCOUNTER — Ambulatory Visit: Payer: Self-pay

## 2019-01-28 NOTE — Telephone Encounter (Signed)
Call to patient for Telehealth if needed.  Message that Clinics had called and to reschedule appointment for follow up. Sander Nephew, RN 01/28/2019 2:06 PM.

## 2019-02-02 ENCOUNTER — Ambulatory Visit: Payer: Self-pay

## 2019-02-02 ENCOUNTER — Encounter: Payer: Self-pay | Admitting: Internal Medicine

## 2019-02-16 ENCOUNTER — Encounter (HOSPITAL_COMMUNITY): Payer: Self-pay | Admitting: *Deleted

## 2019-02-16 ENCOUNTER — Emergency Department (HOSPITAL_COMMUNITY): Payer: Self-pay

## 2019-02-16 ENCOUNTER — Emergency Department (HOSPITAL_COMMUNITY)
Admission: EM | Admit: 2019-02-16 | Discharge: 2019-02-16 | Disposition: A | Discharge status: Home / Self Care | Payer: Self-pay | Attending: Emergency Medicine | Admitting: Emergency Medicine

## 2019-02-16 ENCOUNTER — Other Ambulatory Visit: Payer: Self-pay

## 2019-02-16 DIAGNOSIS — I10 Essential (primary) hypertension: Secondary | ICD-10-CM | POA: Insufficient documentation

## 2019-02-16 DIAGNOSIS — M79644 Pain in right finger(s): Secondary | ICD-10-CM | POA: Insufficient documentation

## 2019-02-16 DIAGNOSIS — Z87891 Personal history of nicotine dependence: Secondary | ICD-10-CM | POA: Insufficient documentation

## 2019-02-16 DIAGNOSIS — Z760 Encounter for issue of repeat prescription: Secondary | ICD-10-CM | POA: Insufficient documentation

## 2019-02-16 MED ORDER — AMLODIPINE BESYLATE 10 MG PO TABS: 10.0000 mg | ORAL_TABLET | Freq: Every day | ORAL | 0 refills | Status: DC

## 2019-02-16 MED ORDER — ONDANSETRON HCL 4 MG PO TABS
4.0000 mg | ORAL_TABLET | Freq: Once | ORAL | Status: AC
  Administered 2019-02-16: 4 mg via ORAL
  Filled 2019-02-16: qty 1

## 2019-02-16 MED ORDER — NAPROXEN 500 MG PO TABS: 500.0000 mg | ORAL_TABLET | Freq: Two times a day (BID) | ORAL | 0 refills | Status: AC | PRN

## 2019-02-16 MED ORDER — OXYCODONE-ACETAMINOPHEN 5-325 MG PO TABS
1.0000 | ORAL_TABLET | Freq: Once | ORAL | Status: AC
  Administered 2019-02-16: 09:00:00 1 via ORAL
  Filled 2019-02-16: qty 1

## 2019-02-16 MED ORDER — AMLODIPINE BESYLATE 5 MG PO TABS: 10.0000 mg | ORAL_TABLET | Freq: Once | ORAL | Status: DC

## 2019-02-16 NOTE — Discharge Instructions (Addendum)
You were seen today for pain in your right thumb. Please read and follow all provided instructions.    1. Medications: alternate naprosyn and tylenol for pain control, usual home medications  2. Treatment: rest, ice, elevate and use brace, drink plenty of fluids, gentle stretching  3. Follow Up: Please followup with orthopedics as directed or your PCP in 1 week if no improvement for discussion of your diagnoses and further evaluation after today's visit; if you do not have a primary care doctor use the resource guide provided to find one; Please return to the ER for worsening symptoms or other concerns

## 2019-02-16 NOTE — ED Notes (Signed)
Patient verbalizes understanding of discharge instructions. Opportunity for questioning and answers were provided. Pt discharged from ED. 

## 2019-02-16 NOTE — ED Triage Notes (Signed)
The pt is c/o rt thumb pain she dtruck it in a drawer just pta  painfful with movement lmp now

## 2019-02-16 NOTE — ED Provider Notes (Addendum)
[facility] EMERGENCY DEPARTMENT Provider Note   CSN: [id number] Arrival date & time: 02/16/19  0551     History   Chief Complaint Chief Complaint  Patient presents with  . Hand Pain    HPI Amber Little is a 33 y.o. right-hand-dominant female with past medical history significant for essential hypertension presents to emergency department today with right thumb pain.  Patient states 1 hour prior to arrival she struck her right thumb on a drawer causing it to hyper extend.  She had immediate pain and swelling.  She describes the pain as constant aching.  She rates the pain 10 of 10 in severity.  She did not take anything for pain prior to arrival.  Pain is located in her right thumb and radiates to her right wrist.  She denies previous injury to right hand.  She denies numbness, weakness, tingling, fever, chills, open wound.  Patient is also requesting refill on hypertension medication.  She reports taking Norvasc 10 mg p.o. daily and has been out for 2 weeks.  She denies any chest pain, shortness of breath, palpitations. History provided by patient with additional history obtained from chart review.     Past Medical History:  Diagnosis Date  . 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 Gyn rec'd 1 yr PAP + HPV cotesting  . Essential hypertension 08/07/2016  . History of tobacco use     Patient Active Problem List   Diagnosis Date Noted  . Trichomoniasis 01/07/2019  . Intermenstrual bleeding 12/31/2018  . GERD (gastroesophageal reflux disease) 11/27/2017  . History of tobacco use   . Shoulder pain, acute 10/09/2017  . 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 Essential hypertension 08/07/2016  . Healthcare maintenance 08/07/2016    History reviewed. No pertinent surgical history.   OB History   No obstetric history on file.      Home Medications    Prior to Admission  medications   Medication Sig Start Date End Date Taking? Authorizing Provider  amLODipine (NORVASC) 10 MG tablet Take 1 tablet (10 mg total) by mouth daily. 02/16/19 03/18/19  ,  E, PA-C  ibuprofen (ADVIL,MOTRIN) 600 MG tablet Take 1 tablet (600 mg total) by mouth every 6 (six) hours as needed. 06/12/18   Fransico Meadow, PA-C  naproxen (NAPROSYN) 500 MG tablet Take 1 tablet (500 mg total) by mouth 2 (two) times daily as needed for up to 15 days. 02/16/19 03/03/19  ,  E, PA-C  hydrochlorothiazide (HYDRODIURIL) 25 MG tablet Take 1 tablet (25 mg total) by mouth daily. 11/27/17 02/16/19  Molt, Bethany, DO    Family History Family History  Problem Relation Age of Onset  . Diabetes Mother   . Hypertension Mother   . Hypertension Father   . Hypertension Paternal Grandmother   . Cancer Neg Hx     Social History Social History   Tobacco Use  . Smoking status: Former Smoker    Packs/day: 0.50    Types: Cigarettes  . Smokeless tobacco: Never Used  Substance Use Topics  . Alcohol use: Yes    Comment: rarely  . Drug use: No     Allergies   Patient has no known allergies.   Review of Systems Review of Systems  Constitutional: Negative for chills and fever.  Respiratory: Negative for shortness of breath.   Cardiovascular: Negative for chest pain and leg swelling.  Musculoskeletal: Positive for  arthralgias and joint swelling.  Skin: Negative for wound.     Physical Exam Updated Vital Signs BP (!) 143/92 (BP Location: Left Arm)   Pulse 67   Temp 98.5 F (36.9 C) (Oral)   Resp 16   Ht 5\' 4"  (1.626 m)   Wt 56.7 kg   LMP 02/16/2019   SpO2 100%   BMI 21.46 kg/m   Physical Exam Vitals signs and nursing note reviewed.  Constitutional:      Appearance: She is well-developed. She is not ill-appearing or toxic-appearing.  HENT:     Head: Normocephalic and atraumatic.     Nose: Nose normal.  Eyes:     General: No scleral icterus.       Right eye: No  discharge.        Left eye: No discharge.     Conjunctiva/sclera: Conjunctivae normal.  Neck:     Musculoskeletal: Normal range of motion.     Vascular: No JVD.  Cardiovascular:     Rate and Rhythm: Normal rate and regular rhythm.     Pulses: Normal pulses.          Radial pulses are 2+ on the right side and 2+ on the left side.     Heart sounds: Normal heart sounds.  Pulmonary:     Effort: Pulmonary effort is normal.     Breath sounds: Normal breath sounds.  Abdominal:     General: There is no distension.  Musculoskeletal: Normal range of motion.     Comments: Right hand with tenderness to palpation of base of right thumb.  + swelling. There is no joint effusion noted. Decreased ROM 2/2 pain. No erythema or warmth overlaying the joint. There is no anatomic snuff box tenderness. Normal sensation and motor function in the median, ulnar, and radial nerve distributions. 2+ radial pulse.   Skin:    General: Skin is warm and dry.  Neurological:     Mental Status: She is oriented to person, place, and time.     GCS: GCS eye subscore is 4. GCS verbal subscore is 5. GCS motor subscore is 6.     Comments: Fluent speech, no facial droop.  Psychiatric:        Behavior: Behavior normal.      ED Treatments / Results  Labs (all labs ordered are listed, but only abnormal results are displayed) Labs Reviewed - No data to display  EKG None  Radiology Dg Wrist Complete Right  Result Date: 02/16/2019 CLINICAL DATA:  Fall from standing with pain in the thumb and wrist. EXAM: RIGHT WRIST - COMPLETE 3+ VIEW COMPARISON:  Right finger radiographs dated 02/13/2016. FINDINGS: There is no evidence of fracture or dislocation. There is no evidence of arthropathy or other focal bone abnormality. Soft tissues are unremarkable. IMPRESSION: Negative. Electronically Signed   By: Romona Curlsyler  Litton M.D.   On: 02/16/2019 08:48   Dg Finger Thumb Right  Result Date: 02/16/2019 CLINICAL DATA:  Acute finger pain  after injury. EXAM: RIGHT THUMB 2+V COMPARISON:  None. FINDINGS: There is no evidence of fracture or dislocation. There is no evidence of arthropathy or other focal bone abnormality. Soft tissues are unremarkable. IMPRESSION: Negative. Electronically Signed   By: Lupita RaiderJames  Green Jr M.D.   On: 02/16/2019 07:10    Procedures Procedures (including critical care time)  Medications Ordered in ED Medications  oxyCODONE-acetaminophen (PERCOCET/ROXICET) 5-325 MG per tablet 1 tablet (1 tablet Oral Given 02/16/19 0846)  ondansetron (ZOFRAN) tablet 4 mg (4 mg  Oral Given 02/16/19 0845)     Initial Impression / Assessment and Plan / ED Course  I have reviewed the triage vital signs and the nursing notes.  Pertinent labs & imaging results that were available during my care of the patient were reviewed by me and considered in my medical decision making (see chart for details).  Patient presents to the ED with complaints of pain to the right thumb s/p hyperextending. Exam without obvious deformity or open wounds. ROM intact. Tender to palpation at base of thumb. No snuffbox tenderness. NVI distally. Xray or right thumb and wrist are without fracture/dislocation. Therapeutic splint provided. PRICE, naproxen, tylenol recommended. I discussed results, treatment plan, need for follow-up, and return precautions with the patient. Provided opportunity for questions, patient confirmed understanding and are in agreement with plan.   Will also refill hypertensive medication, pt aware she needs to follow up with pcp for further refills. She has empty bottle at bedside to confirm dose. Findings and plan of care discussed with supervising physician Dr. Freida Busman.  Portions of this note were generated with Scientist, clinical (histocompatibility and immunogenetics). Dictation errors may occur despite best attempts at proofreading.   Final Clinical Impressions(s) / ED Diagnoses   Final diagnoses:  Pain of right thumb  Medication refill    ED Discharge Orders          Ordered    naproxen (NAPROSYN) 500 MG tablet  2 times daily PRN     02/16/19 0931    amLODipine (NORVASC) 10 MG tablet  Daily     02/16/19 0936           Sherene Sires, PA-C 02/16/19 0934    Sherene Sires, PA-C 02/16/19 0936    Lorre Nick, MD 02/17/19 [phone]

## 2019-02-23 ENCOUNTER — Telehealth: Payer: Self-pay

## 2019-02-23 NOTE — Telephone Encounter (Signed)
Requesting to speak with Dr. Seawell, please call pt back.  

## 2019-02-23 NOTE — Telephone Encounter (Signed)
Pt called to see where her partner could get treatment and/or test for trichomoniasis. Suggested [facility], she states called there and they do not do that. Suggested urg care, she was agreeable.

## 2019-02-24 NOTE — Telephone Encounter (Signed)
Thank you, agree with plan. If unable obtain treatment that way I can check and see if this is something we can do through partner treatment.

## 2019-03-03 ENCOUNTER — Other Ambulatory Visit: Payer: Self-pay

## 2019-03-03 DIAGNOSIS — Z20822 Contact with and (suspected) exposure to covid-19: Secondary | ICD-10-CM

## 2019-03-04 LAB — NOVEL CORONAVIRUS, NAA: SARS-CoV-2, NAA: NOT DETECTED

## 2019-03-30 ENCOUNTER — Other Ambulatory Visit: Payer: Self-pay

## 2019-03-30 DIAGNOSIS — Z20822 Contact with and (suspected) exposure to covid-19: Secondary | ICD-10-CM

## 2019-03-31 LAB — NOVEL CORONAVIRUS, NAA: SARS-CoV-2, NAA: NOT DETECTED

## 2019-04-01 IMAGING — CR DG CHEST 2V
2 series · 2 of 2 positions shown · non-contrast
Comparison: 02/08/2008 chest radiograph.

CLINICAL DATA: Chest tightness and dyspnea

EXAM:
CHEST - 2 VIEW

[w chest pa]
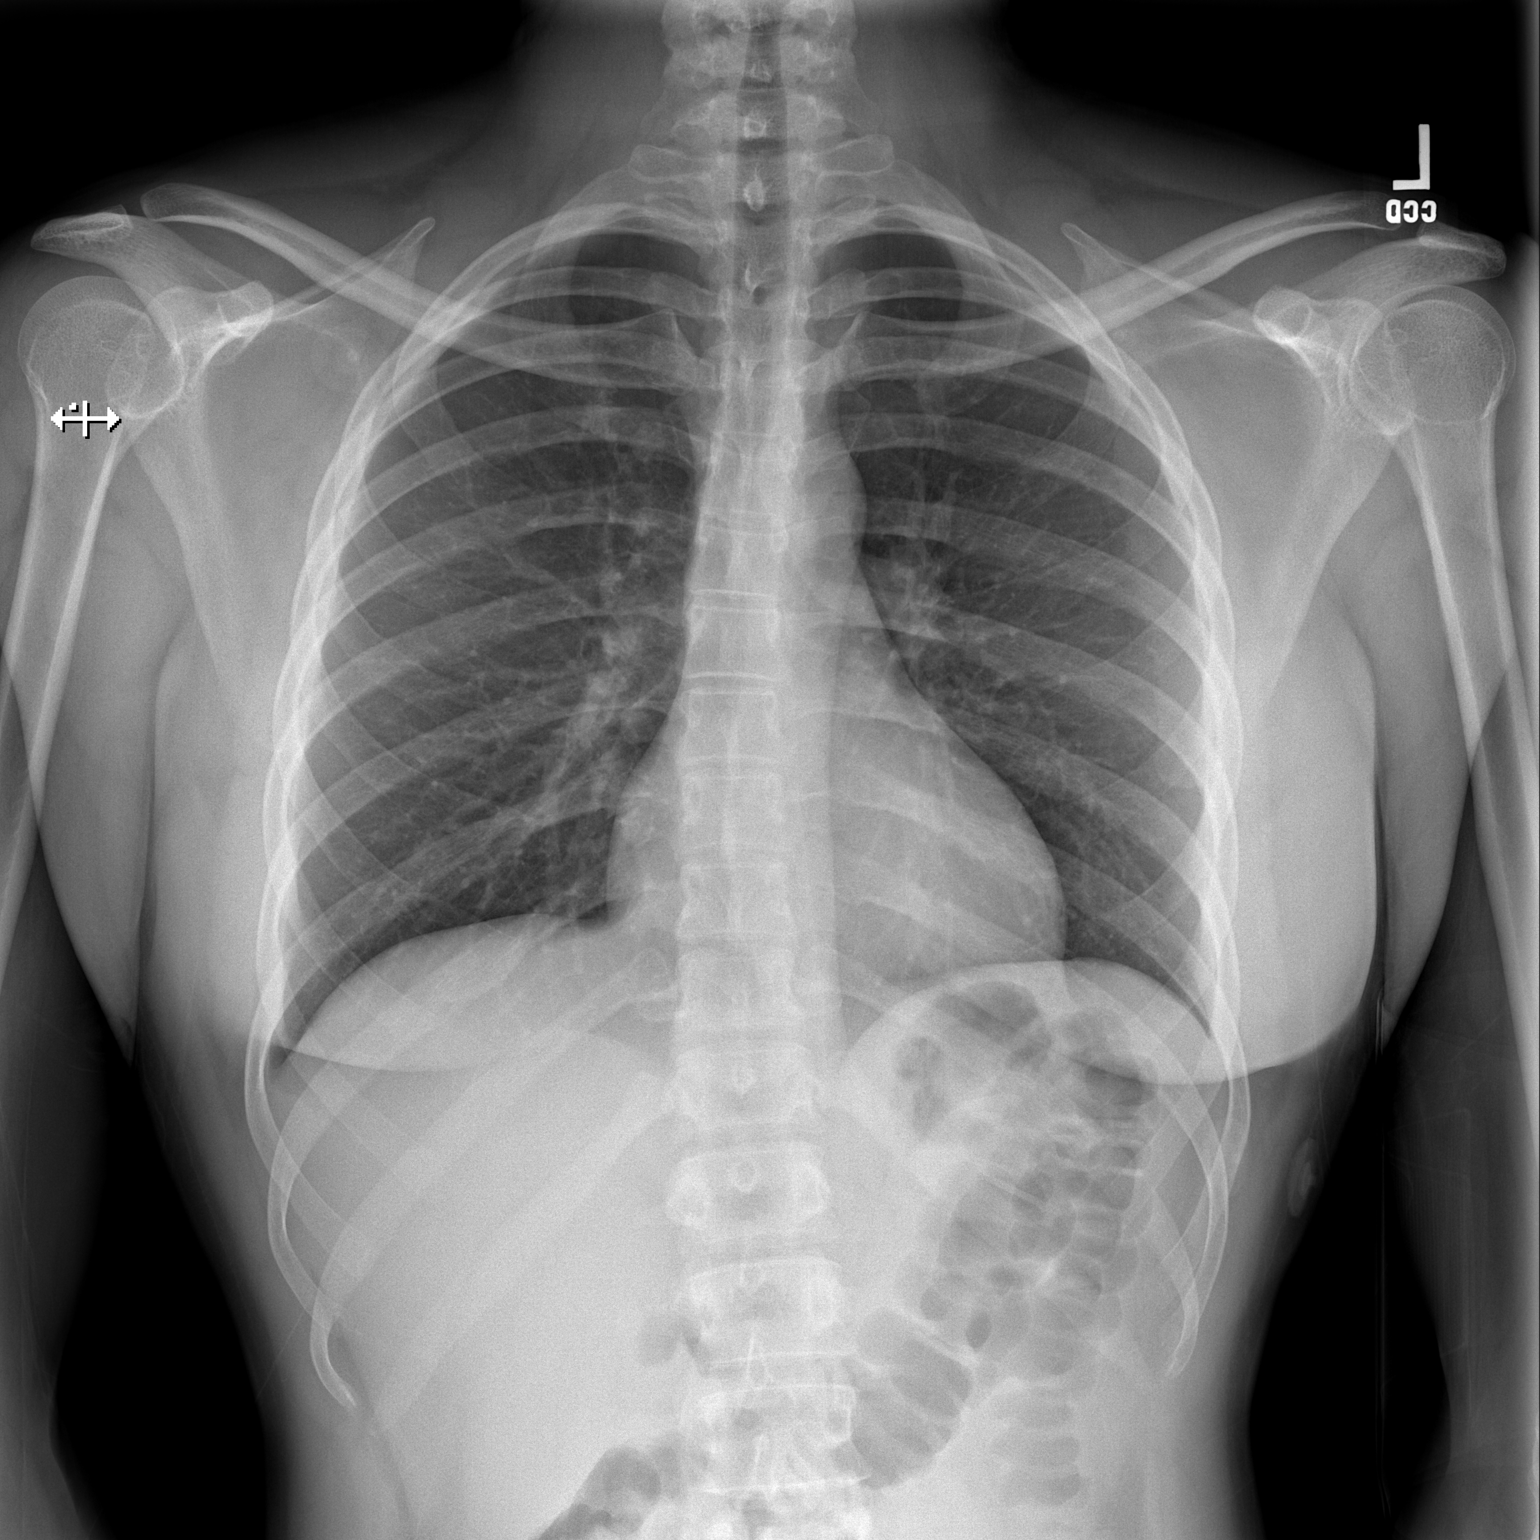

[w chest lat]
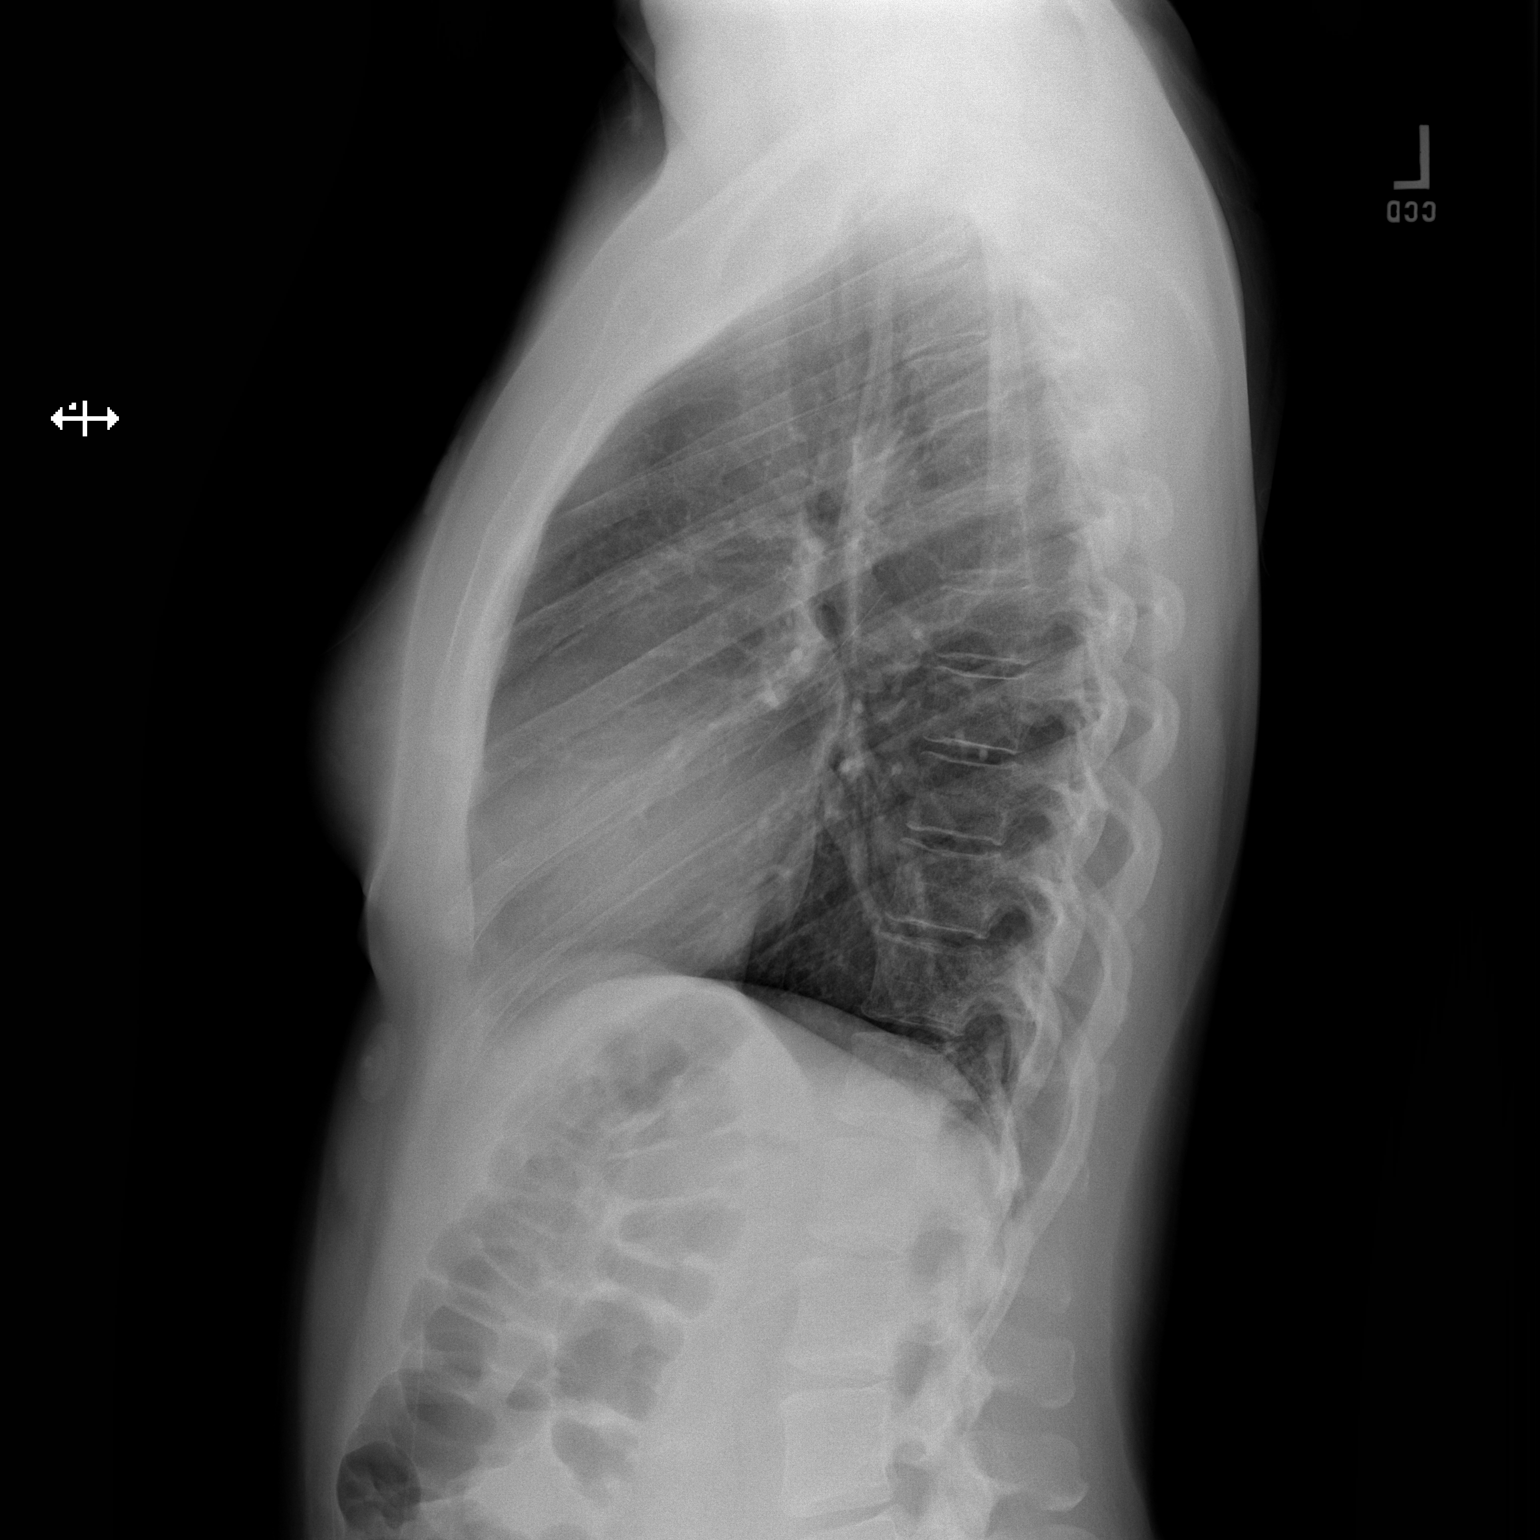

[2 of 2 positions shown; findings below may reference images not displayed]

FINDINGS: Stable cardiomediastinal silhouette with normal heart size. No
pneumothorax. No pleural effusion. Lungs appear clear, with no acute
consolidative airspace disease and no pulmonary edema.
IMPRESSION: No active cardiopulmonary disease.

## 2019-04-16 ENCOUNTER — Other Ambulatory Visit: Payer: Self-pay

## 2019-04-16 ENCOUNTER — Emergency Department (HOSPITAL_COMMUNITY)
Admission: EM | Admit: 2019-04-16 | Discharge: 2019-04-17 | Disposition: A | Discharge status: Home / Self Care | Payer: No Typology Code available for payment source | Attending: Emergency Medicine | Admitting: Emergency Medicine

## 2019-04-16 DIAGNOSIS — R519 Headache, unspecified: Secondary | ICD-10-CM | POA: Diagnosis not present

## 2019-04-16 DIAGNOSIS — M25511 Pain in right shoulder: Secondary | ICD-10-CM | POA: Insufficient documentation

## 2019-04-16 DIAGNOSIS — M542 Cervicalgia: Secondary | ICD-10-CM | POA: Diagnosis present

## 2019-04-16 DIAGNOSIS — I10 Essential (primary) hypertension: Secondary | ICD-10-CM | POA: Insufficient documentation

## 2019-04-16 DIAGNOSIS — Y939 Activity, unspecified: Secondary | ICD-10-CM | POA: Insufficient documentation

## 2019-04-16 DIAGNOSIS — Y999 Unspecified external cause status: Secondary | ICD-10-CM | POA: Diagnosis not present

## 2019-04-16 DIAGNOSIS — S161XXA Strain of muscle, fascia and tendon at neck level, initial encounter: Secondary | ICD-10-CM | POA: Diagnosis not present

## 2019-04-16 DIAGNOSIS — Y929 Unspecified place or not applicable: Secondary | ICD-10-CM | POA: Insufficient documentation

## 2019-04-16 DIAGNOSIS — Z87891 Personal history of nicotine dependence: Secondary | ICD-10-CM | POA: Insufficient documentation
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
## 2019-04-17 MED ORDER — IBUPROFEN 600 MG PO TABS: 600.0000 mg | ORAL_TABLET | Freq: Four times a day (QID) | ORAL | 0 refills | Status: DC | PRN

## 2019-04-17 MED ORDER — METHOCARBAMOL 500 MG PO TABS: 500.0000 mg | ORAL_TABLET | Freq: Two times a day (BID) | ORAL | 0 refills | Status: DC

## 2019-04-17 MED ORDER — ACETAMINOPHEN 500 MG PO TABS: 500.0000 mg | ORAL_TABLET | Freq: Four times a day (QID) | ORAL | 0 refills | Status: AC | PRN

## 2019-04-17 NOTE — ED Notes (Signed)
ED Provider at bedside. 
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
## 2020-04-05 ENCOUNTER — Encounter: Payer: Self-pay | Admitting: Internal Medicine

## 2020-04-11 ENCOUNTER — Other Ambulatory Visit: Payer: Self-pay

## 2020-04-11 ENCOUNTER — Ambulatory Visit: Payer: Self-pay | Admitting: Internal Medicine

## 2020-04-11 ENCOUNTER — Ambulatory Visit: Payer: Self-pay

## 2020-04-11 ENCOUNTER — Encounter: Payer: Self-pay | Admitting: Internal Medicine

## 2020-04-11 VITALS — BP 149/84 | HR 70 | Temp 98.1°F | Ht 64.0 in | Wt 122.9 lb

## 2020-04-11 DIAGNOSIS — Z30011 Encounter for initial prescription of contraceptive pills: Secondary | ICD-10-CM

## 2020-04-11 DIAGNOSIS — Z3202 Encounter for pregnancy test, result negative: Secondary | ICD-10-CM

## 2020-04-11 DIAGNOSIS — I10 Essential (primary) hypertension: Secondary | ICD-10-CM

## 2020-04-11 DIAGNOSIS — Z30019 Encounter for initial prescription of contraceptives, unspecified: Secondary | ICD-10-CM

## 2020-04-11 LAB — POCT URINE PREGNANCY: Preg Test, Ur: NEGATIVE

## 2020-04-11 MED ORDER — AMLODIPINE BESYLATE 10 MG PO TABS: 10.0000 mg | ORAL_TABLET | Freq: Every day | ORAL | 0 refills | Status: DC

## 2020-04-11 MED ORDER — NORGESTIMATE-ETH ESTRADIOL 0.25-35 MG-MCG PO TABS: 1.0000 | ORAL_TABLET | Freq: Every day | ORAL | 11 refills | Status: DC

## 2020-04-11 NOTE — Progress Notes (Signed)
   CC: Discussion of birth control, and follow up of hypertension  HPI:  [name] is a 34 y.o. with a history of hypertension presenting for follow up of her hypertension and discussion about birth control.   Past Medical History:  Diagnosis Date  . 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 Gyn rec'd 1 yr PAP + HPV cotesting  . Essential hypertension 08/07/2016  . History of tobacco use    Review of Systems:   Constitutional: Negative for chills and fever.  Respiratory: Negative for shortness of breath.   Cardiovascular: Negative for chest pain and leg swelling.  Gastrointestinal: Negative for abdominal pain, nausea and vomiting.  Neurological: Negative for dizziness and headaches.   Physical Exam:  Vitals:   04/11/20 1337 04/11/20 1404  BP: (!) 150/80 (!) 149/84  Pulse: 71 70  Temp:  98.1 F (36.7 C)  TempSrc:  Oral  SpO2: 100%   Weight: 122 lb 14.4 oz (55.7 kg)   Height: 5\' 4"  (1.626 m)    Physical Exam Constitutional:      Appearance: Normal appearance.  HENT:     Mouth/Throat:     Mouth: Mucous membranes are moist.     Pharynx: Oropharynx is clear.  Cardiovascular:     Rate and Rhythm: Normal rate and regular rhythm.     Pulses: Normal pulses.     Heart sounds: Normal heart sounds.  Pulmonary:     Effort: Pulmonary effort is normal.     Breath sounds: Normal breath sounds.  Abdominal:     General: Abdomen is flat. Bowel sounds are normal.     Palpations: Abdomen is soft.  Musculoskeletal:     Cervical back: Normal range of motion and neck supple.  Skin:    General: Skin is warm and dry.     Capillary Refill: Capillary refill takes less than 2 seconds.  Neurological:     General: No focal deficit present.     Mental Status: She is alert and oriented to person, place, and time.  Psychiatric:        Mood and Affect: Mood normal.        Behavior: Behavior normal.      Assessment & Plan:   See  Encounters Tab for problem based charting.  Patient discussed with Dr. 

## 2020-04-11 NOTE — Patient Instructions (Addendum)
Ms. Amber Little,  It was a pleasure to see you today. Thank you for coming in.   Today we discussed your blood pressure. In regards to this please starting taking the amlodipine daily. Please monitor your blood pressures at home. The goal is to bring your BP to <130/80.   We also discussed your birth control option. You can start using the birth control tablets daily. Please let [place] know if you have any issues with this medication. We are checking a urine pregnancy test.   Please return to clinic in 1 month or sooner if needed.   Thank you again for coming in.   Claudean Severance.D.

## 2020-04-12 DIAGNOSIS — Z30019 Encounter for initial prescription of contraceptives, unspecified: Secondary | ICD-10-CM

## 2020-04-12 HISTORY — DX: Encounter for initial prescription of contraceptives, unspecified: Z30.019

## 2020-04-12 NOTE — Assessment & Plan Note (Addendum)
She reports that she is is currently sexually active.  Her regular partner is a woman however approximately 1 month ago she was sexually active with a female, states that the condom broke at that time.  She endorses that her last period was heavier then normal, approximately 1-2 weeks ago.  Menstruation started around age of 31, last for 8 to 9 days, occurs every month, typically will use 1-2 pads per day, have her last menstrual period she was using approximately 3 pads per day.  She reports a positive pregnancy test years ago, on repeat it was negative, she is not sure if she had a miscarriage or why it was positive. She denies any history of blood clots, denies any family history of blood clots.  Denies smoking.  She denies any dysuria, hematuria, abdominal pain, nausea, vomiting, vaginal discharge, or other symptoms.  She is interested in getting started on birth control to help regulate her period. We discussed options, she wanted to go with the pill form. Will do low dose estrogen birth control.   -Urine pregnancy test -norgestimate-ethinyl estradiol daily

## 2020-04-12 NOTE — Progress Notes (Signed)
Internal Medicine Clinic Attending ° °Case discussed with Dr. Krienke  At the time of the visit.  We reviewed the resident’s history and exam and pertinent patient test results.  I agree with the assessment, diagnosis, and plan of care documented in the resident’s note.  °

## 2020-04-12 NOTE — Assessment & Plan Note (Signed)
Patient is currently prescribed amlodipine 10 mg daily, she reports only taking it a few times per week, took it 3 times this past week.  She states that she occasionally will check her blood pressures at home and it is around 150s over 85.  She denies any headaches, lightheadedness, dizziness, fatigue, weakness or other symptoms.  She denies any smoking history.  Reports that her diet has improved, trying to decrease her fast food intake eating more fruits and vegetables. Blood pressure today is elevated at 150/80, repeat was 149/84.  We discussed the importance of blood pressure control.  We discussed that the goal is 130/80, and that a normal blood pressure is <120/80.  Discussed the risks of untreated hypertension including increased risk of stroke, increased risk of heart attack, kidney injury, and other complications.    -Continue amlodipine 10 mg daily, patient reports that she will start taking it daily -Provided ambulatory blood pressure monitor -Provided information on DASH diet -RTC in 1 month, consider addition of lisinopril if blood pressure still elevated

## 2020-05-02 ENCOUNTER — Ambulatory Visit: Payer: Self-pay

## 2020-05-11 ENCOUNTER — Ambulatory Visit: Payer: Self-pay

## 2020-07-12 ENCOUNTER — Encounter: Payer: Self-pay | Admitting: Student

## 2020-07-12 ENCOUNTER — Ambulatory Visit: Payer: Self-pay | Admitting: Student

## 2020-07-12 ENCOUNTER — Other Ambulatory Visit: Payer: Self-pay

## 2020-07-12 ENCOUNTER — Ambulatory Visit: Payer: Self-pay

## 2020-07-12 VITALS — BP 144/94 | HR 62 | Temp 98.6°F | Resp 100 | Ht 64.0 in | Wt 123.5 lb

## 2020-07-12 DIAGNOSIS — R590 Localized enlarged lymph nodes: Secondary | ICD-10-CM

## 2020-07-12 DIAGNOSIS — Z30019 Encounter for initial prescription of contraceptives, unspecified: Secondary | ICD-10-CM

## 2020-07-12 DIAGNOSIS — I10 Essential (primary) hypertension: Secondary | ICD-10-CM

## 2020-07-12 MED ORDER — AMLODIPINE BESYLATE 10 MG PO TABS: 10.0000 mg | ORAL_TABLET | Freq: Every day | ORAL | 3 refills | Status: DC

## 2020-07-12 NOTE — Assessment & Plan Note (Addendum)
SUBJECTIVE: Since patient's last visit, she was adherent to her prescribed amlodipine however she ran out one month after her appointment. She denies any adverse effects of the medication. She states that her legs sometimes feel swollen when she's taking a hot shower, however this is not bothersome for her. She is interested in having a longer supply of her antihypertensive so that she does not run out of the medication.  OBJECTIVE: BP 151/101 then 144/94  HR 62  ASSESSMENT/PLAN: Poorly controlled hypertension in the setting of nonadherence secondary to a lack of refills after missing her 51-month follow-up appointment. -Restart amlodipine 10mg  (90-day supply with three refills) -Reassess blood pressure at follow-up appointment to determine if second agent is warranted

## 2020-07-12 NOTE — Assessment & Plan Note (Signed)
SUBJECTIVE: Patient endorses upper respiratory infection approximately one month ago. She noticed two bumps behind her right ear following this episode. The bumps are not tender, itchy, or growing in size. She states that they do not bother her but she is curious what they might be.  OBJECTIVE: Head/Neck: Two, small mobile, firm, posterior auricular lymph nodes  ASSESSMENT/PLAN: Patient presenting with persistent lymphadenopathy of the posterior auricular region following a recent URI. -Continue to monitor for symptoms or growth

## 2020-07-12 NOTE — Patient Instructions (Signed)
Amber Little,  It was a pleasure meeting you in clinic today.  For your hypertension (high blood pressure): I have refilled your amlodipine 10mg . You will now receive a 90-day supply with each refill. I have provided three refills on the prescription so you can continue to refill the medication at your pharmacy when you run out.  For your birth control: You can continue to refill your birth control medication at your pharmacy every month whenever you run out. Continue taking this medication daily.  For the bumps behind your ear: These bumps are consistent with enlarged lymph nodes (lymphadenopathy). These lymph nodes become swollen after an infection and may take some time to return to normal size. There are no findings concerning for something more serious. Continue to check on the bumps every few weeks to see if they have grown, shrunk or stayed about the same. If they become significantly more swollen or tender we can take another look at them.  Sincerely, , MD

## 2020-07-12 NOTE — Progress Notes (Signed)
   CC: HTN  HPI:  [name] is a 35 y.o. with past medical history significant for HTN who presents for 82-month follow-up. Refer to problem list for charting of this encounter.  Past Medical History:  Diagnosis Date  . 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 Gyn rec'd 1 yr PAP + HPV cotesting  . Essential hypertension 08/07/2016  . History of tobacco use    Review of Systems:  Endorses occasional headaches. Denies nausea, vomiting, diarrhea, lightheadedness, dizziness, chest pain, shortness of breath.  Physical Exam:  Vitals:   07/12/20 1323 07/12/20 1329  BP: (!) 151/101 (!) 144/94  Pulse: 62   Resp: (!) 100   Temp: 98.6 F (37 C)   TempSrc: Oral   Weight: 123 lb 8 oz (56 kg)   Height: 5\' 4"  (1.626 m)    Physical Exam Constitutional:      General: She is not in acute distress.    Appearance: She is not ill-appearing.  HENT:     Head: Normocephalic and atraumatic.  Cardiovascular:     Rate and Rhythm: Normal rate and regular rhythm.  Musculoskeletal:     Right lower leg: No edema.     Left lower leg: No edema.  Lymphadenopathy:     Cervical: Cervical adenopathy present.  Skin:    General: Skin is warm and dry.  Neurological:     General: No focal deficit present.     Mental Status: She is alert. Mental status is at baseline.  Psychiatric:        Mood and Affect: Mood normal.        Behavior: Behavior normal.      Assessment & Plan:   See Encounters Tab for problem based charting.  Patient discussed with Dr. 

## 2020-07-15 NOTE — Progress Notes (Signed)
Internal Medicine Clinic Attending  Case discussed with Dr. Johnson  At the time of the visit.  We reviewed the resident's history and exam and pertinent patient test results.  I agree with the assessment, diagnosis, and plan of care documented in the resident's note.  

## 2020-07-19 IMAGING — DX DG FINGER THUMB 2+V*R*
3 series · 3 of 3 positions shown · non-contrast
Comparison: None.

CLINICAL DATA: Acute finger pain after injury.

EXAM:
RIGHT THUMB 2+V

[finger ap]
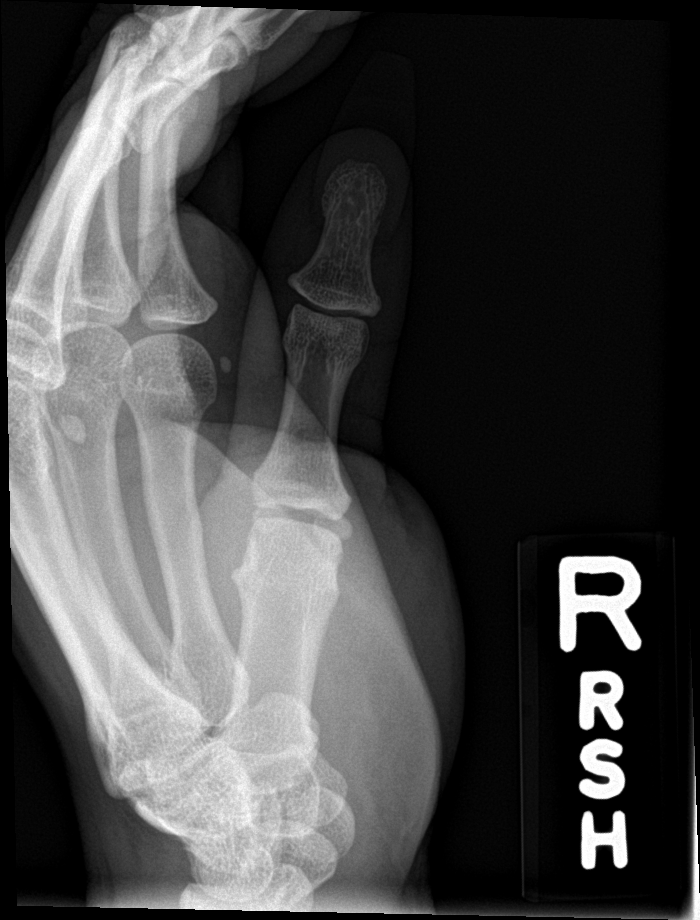

[finger obl]
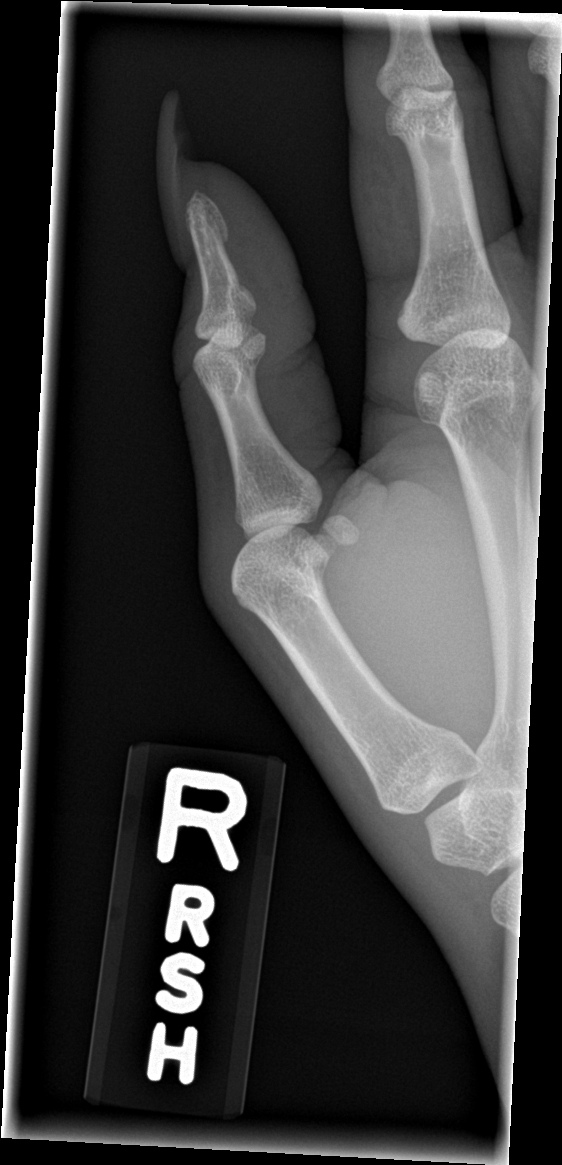

[finger lat]
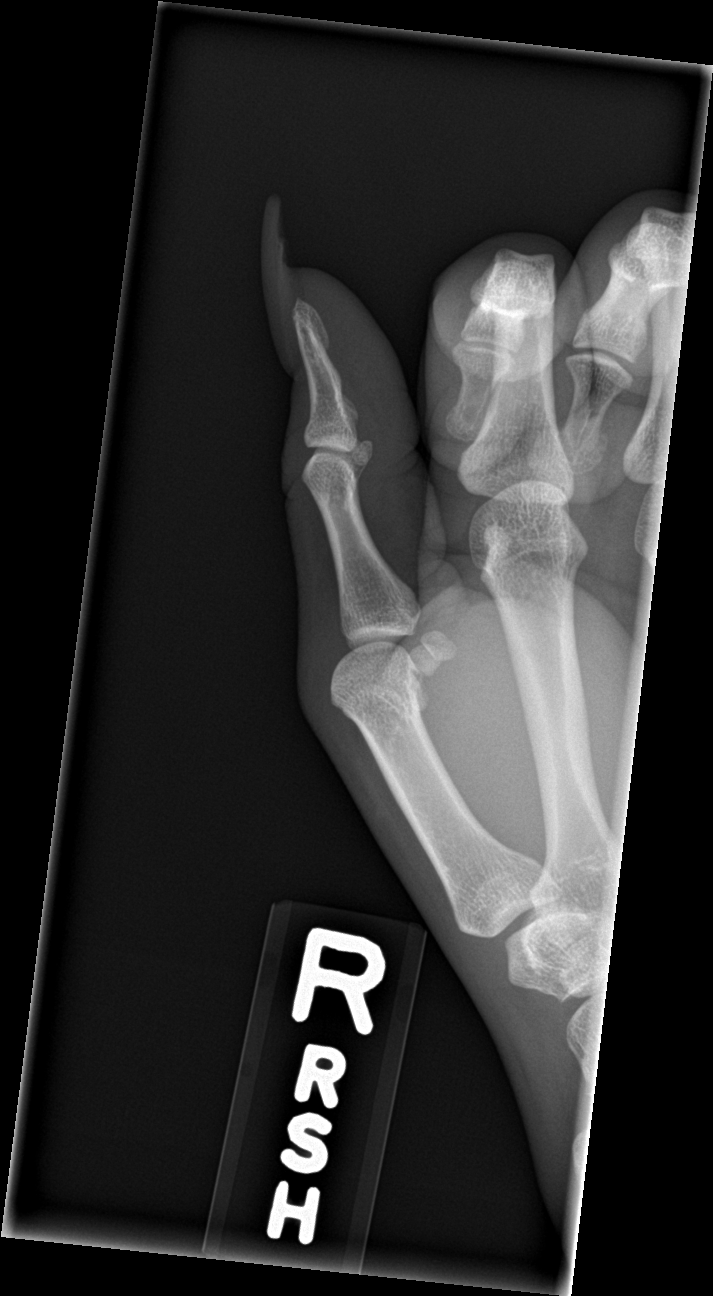

[3 of 3 positions shown; findings below may reference images not displayed]

FINDINGS: There is no evidence of fracture or dislocation. There is no
evidence of arthropathy or other focal bone abnormality. Soft
tissues are unremarkable.
IMPRESSION: Negative.

## 2020-11-03 ENCOUNTER — Encounter (HOSPITAL_COMMUNITY): Payer: Self-pay | Admitting: Emergency Medicine

## 2020-11-03 ENCOUNTER — Emergency Department (HOSPITAL_COMMUNITY)
Admission: EM | Admit: 2020-11-03 | Discharge: 2020-11-04 | Disposition: A | Discharge status: Home / Self Care | Payer: Self-pay | Attending: Emergency Medicine | Admitting: Emergency Medicine

## 2020-11-03 ENCOUNTER — Other Ambulatory Visit: Payer: Self-pay

## 2020-11-03 DIAGNOSIS — I10 Essential (primary) hypertension: Secondary | ICD-10-CM | POA: Insufficient documentation

## 2020-11-03 DIAGNOSIS — K0889 Other specified disorders of teeth and supporting structures: Secondary | ICD-10-CM | POA: Insufficient documentation

## 2020-11-03 DIAGNOSIS — Z87891 Personal history of nicotine dependence: Secondary | ICD-10-CM | POA: Insufficient documentation

## 2020-11-03 DIAGNOSIS — Z79899 Other long term (current) drug therapy: Secondary | ICD-10-CM | POA: Insufficient documentation

## 2020-11-03 DIAGNOSIS — R519 Headache, unspecified: Secondary | ICD-10-CM

## 2020-11-03 NOTE — ED Triage Notes (Signed)
Pt reports rt sided facial pain intermittently x2 weeks, pt thinks is from her wisdom teeth. Pt reports pain is causing headaches and her BP high. Hx of htn, compliant with medications.

## 2020-11-03 NOTE — ED Provider Notes (Signed)
Emergency Medicine Provider Triage Evaluation Note  Amber Little , a 35 y.o. female  was evaluated in triage.  Pt complains of sided facial/dental pain for the past few weeks.  It has been intermittent and will improve with pain medicine but then will return.  She is concerned it may be due to her wisdom teeth.  Denies any lip or tongue swelling, trouble breathing or trouble swallowing.  Review of Systems  Positive: Right-sided face pain Negative: Trouble breathing, trouble swallowing, lip or tongue swelling  Physical Exam  BP (!) 158/102 (BP Location: Left Arm)   Pulse 67   Temp 98.8 F (37.1 C) (Oral)   Resp 16   SpO2 100%  Gen:   Awake, no distress Resp:  Normal effort MSK:   Moves extremities without difficulty Other:  No gross dental abscess or facial swelling noted.  There appeared to be wisdom teeth emerging  Medical Decision Making  Medically screening exam initiated at 9:10 PM.  Appropriate orders placed.  Amber Little was informed that the remainder of the evaluation will be completed by another provider, this initial triage assessment does not replace that evaluation, and the importance of remaining in the ED until their evaluation is complete.  Will likely need pain control   Amber Pates, PA-C 11/03/20 2111    Amber Sprout, MD 11/04/20 [phone]

## 2020-11-04 MED ORDER — PENICILLIN V POTASSIUM 250 MG PO TABS
500.0000 mg | ORAL_TABLET | Freq: Once | ORAL | Status: AC
  Administered 2020-11-04: 500 mg via ORAL
  Filled 2020-11-04: qty 2

## 2020-11-04 MED ORDER — OXYCODONE-ACETAMINOPHEN 5-325 MG PO TABS
1.0000 | ORAL_TABLET | Freq: Once | ORAL | Status: AC
  Administered 2020-11-04: 1 via ORAL
  Filled 2020-11-04: qty 1

## 2020-11-04 MED ORDER — PENICILLIN V POTASSIUM 500 MG PO TABS: 500.0000 mg | ORAL_TABLET | Freq: Three times a day (TID) | ORAL | 0 refills | Status: DC

## 2020-11-04 MED ORDER — KETOROLAC TROMETHAMINE 60 MG/2ML IM SOLN
60.0000 mg | Freq: Once | INTRAMUSCULAR | Status: AC
  Administered 2020-11-04: 60 mg via INTRAMUSCULAR
  Filled 2020-11-04: qty 2

## 2020-11-04 NOTE — ED Provider Notes (Signed)
[facility] EMERGENCY DEPARTMENT Provider Note   CSN: [id number] Arrival date & time: 11/03/20  2047     History Chief Complaint  Patient presents with  . Facial Pain    Amber Little is a 35 y.o. female.   Dental Pain Location:  Generalized Quality:  Aching and sharp Severity:  Mild Onset quality:  Gradual Duration:  3 weeks Timing:  Intermittent Chronicity:  New Context: not abscess, cap still on and not crown fracture   Relieved by:  None tried Worsened by:  Nothing Ineffective treatments:  None tried Associated symptoms: no congestion and no fever        Past Medical History:  Diagnosis Date  . 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 Gyn rec'd 1 yr PAP + HPV cotesting  . Essential hypertension 08/07/2016  . History of tobacco use     Patient Active Problem List   Diagnosis Date Noted  . Reactive cervical lymphadenopathy 07/12/2020  . Encounter for female birth control 04/12/2020  . Intermenstrual bleeding 12/31/2018  . GERD (gastroesophageal reflux disease) 11/27/2017  . History of tobacco use   . 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 Essential hypertension 08/07/2016  . Healthcare maintenance 08/07/2016    History reviewed. No pertinent surgical history.   OB History   No obstetric history on file.     Family History  Problem Relation Age of Onset  . Diabetes Mother   . Hypertension Mother   . Hypertension Father   . Hypertension Paternal Grandmother   . Cancer Neg Hx     Social History   Tobacco Use  . Smoking status: Former Smoker    Packs/day: 0.50    Types: Cigarettes  . Smokeless tobacco: Never Used  Substance Use Topics  . Alcohol use: Yes    Comment: rarely  . Drug use: No    Home Medications Prior to Admission medications   Medication Sig Start Date End Date Taking? Authorizing Provider  penicillin v potassium  (VEETID) 500 MG tablet Take 1 tablet (500 mg total) by mouth 3 (three) times daily. 11/04/20  Yes Cory Kitt, Barbara Cower, MD  acetaminophen (TYLENOL) 500 MG tablet Take 1 tablet (500 mg total) by mouth every 6 (six) hours as needed. 04/17/19   Law, Waylan Boga, PA-C  amLODipine (NORVASC) 10 MG tablet Take 1 tablet (10 mg total) by mouth daily. 07/12/20 07/07/21  Roylene Reason, MD  ibuprofen (ADVIL) 600 MG tablet Take 1 tablet (600 mg total) by mouth every 6 (six) hours as needed. 04/17/19   Law, Waylan Boga, PA-C  norgestimate-ethinyl estradiol (ORTHO-CYCLEN) 0.25-35 MG-MCG tablet Take 1 tablet by mouth daily. 04/11/20   Claudean Severance, MD  hydrochlorothiazide (HYDRODIURIL) 25 MG tablet Take 1 tablet (25 mg total) by mouth daily. 11/27/17 02/16/19  Molt, Bethany, DO    Allergies    Patient has no known allergies.  Review of Systems   Review of Systems  Constitutional: Negative for fever.  HENT: Negative for congestion.   All other systems reviewed and are negative.   Physical Exam Updated Vital Signs BP (!) 145/111 (BP Location: Left Arm)   Pulse 68   Temp 98.2 F (36.8 C) (Oral)   Resp 16   Ht 5\' 4"  (1.626 m)   Wt 54.4 kg   SpO2 100%   BMI 20.60 kg/m   Physical Exam Vitals and nursing note reviewed.  Constitutional:  Appearance: She is well-developed.  HENT:     Head: Normocephalic and atraumatic.     Comments: Pain in upper and lower jaw posteriorly around wisdom teeth on right    Mouth/Throat:     Mouth: Mucous membranes are moist.     Pharynx: Oropharynx is clear.  Eyes:     Pupils: Pupils are equal, round, and reactive to light.  Cardiovascular:     Rate and Rhythm: Normal rate and regular rhythm.  Pulmonary:     Effort: No respiratory distress.     Breath sounds: No stridor.  Abdominal:     General: Abdomen is flat. There is no distension.  Musculoskeletal:     Cervical back: Normal range of motion.  Skin:    General: Skin is warm and dry.  Neurological:      General: No focal deficit present.     Mental Status: She is alert.     ED Results / Procedures / Treatments   Labs (all labs ordered are listed, but only abnormal results are displayed) Labs Reviewed - No data to display  EKG None  Radiology No results found.  Procedures Procedures   Medications Ordered in ED Medications  ketorolac (TORADOL) injection 60 mg (has no administration in time range)  oxyCODONE-acetaminophen (PERCOCET/ROXICET) 5-325 MG per tablet 1 tablet (has no administration in time range)  penicillin v potassium (VEETID) tablet 500 mg (has no administration in time range)    ED Course  I have reviewed the triage vital signs and the nursing notes.  Pertinent labs & imaging results that were available during my care of the patient were reviewed by me and considered in my medical decision making (see chart for details).    MDM Rules/Calculators/A&P                          No trismus, abscess or other emergent complication from dental pain. Possibly infected. Plan for treatmetn here and continued Dental follow up.  Final Clinical Impression(s) / ED Diagnoses Final diagnoses:  Facial pain  Pain, dental    Rx / DC Orders ED Discharge Orders         Ordered    penicillin v potassium (VEETID) 500 MG tablet  3 times daily        11/04/20 0215           Talley Casco, Barbara Cower, MD 11/04/20 0221

## 2020-11-17 ENCOUNTER — Encounter: Payer: Self-pay | Admitting: *Deleted

## 2020-12-06 ENCOUNTER — Encounter: Payer: Self-pay | Admitting: *Deleted

## 2021-07-06 ENCOUNTER — Encounter (HOSPITAL_COMMUNITY): Payer: Self-pay

## 2021-07-06 ENCOUNTER — Other Ambulatory Visit: Payer: Self-pay

## 2021-07-06 ENCOUNTER — Emergency Department (HOSPITAL_COMMUNITY)
Admission: EM | Admit: 2021-07-06 | Discharge: 2021-07-06 | Disposition: A | Discharge status: Home / Self Care | Payer: Self-pay | Attending: Emergency Medicine | Admitting: Emergency Medicine

## 2021-07-06 DIAGNOSIS — I10 Essential (primary) hypertension: Secondary | ICD-10-CM

## 2021-07-06 DIAGNOSIS — R220 Localized swelling, mass and lump, head: Secondary | ICD-10-CM | POA: Insufficient documentation

## 2021-07-06 DIAGNOSIS — R519 Headache, unspecified: Secondary | ICD-10-CM

## 2021-07-06 MED ORDER — AMLODIPINE BESYLATE 10 MG PO TABS: 10.0000 mg | ORAL_TABLET | Freq: Every day | ORAL | 0 refills | Status: DC

## 2021-07-06 MED ORDER — FLUTICASONE PROPIONATE 50 MCG/ACT NA SUSP
1.0000 | Freq: Every day | NASAL | Status: DC
  Administered 2021-07-06: 1 via NASAL
  Filled 2021-07-06: qty 16

## 2021-07-06 NOTE — ED Triage Notes (Signed)
Pt arrived POV c/o facial pain under her left eye. Pt states it has been throbbing for 2 days. Pt states she is unsure if it is sinuses but is worried.

## 2021-07-06 NOTE — ED Provider Notes (Signed)
[facility] EMERGENCY DEPARTMENT Provider Note   CSN: [id number] Arrival date & time: 07/06/21  1313     History  Chief Complaint  Patient presents with   Facial Pain    Amber Little is a 36 y.o. female here with L facial swelling. Patient states that she fell out of bed several days ago. Has intermittent swelling of the L face.  Patient states that the facial swelling is intermittent.  She noticed that her left eye is slightly puffy recently.  Denies any purulent drainage from the eye.  Denies any purulent or greenish drainage from the nose either.  Denies any ear pain.  Patient also ran out of her Norvasc.  The history is provided by the patient.      Home Medications Prior to Admission medications   Medication Sig Start Date End Date Taking? Authorizing Provider  acetaminophen (TYLENOL) 500 MG tablet Take 1 tablet (500 mg total) by mouth every 6 (six) hours as needed. 04/17/19   Law, Waylan Boga, PA-C  amLODipine (NORVASC) 10 MG tablet Take 1 tablet (10 mg total) by mouth daily. 07/12/20 07/07/21  Roylene Reason, MD  ibuprofen (ADVIL) 600 MG tablet Take 1 tablet (600 mg total) by mouth every 6 (six) hours as needed. 04/17/19   Law, Waylan Boga, PA-C  norgestimate-ethinyl estradiol (ORTHO-CYCLEN) 0.25-35 MG-MCG tablet Take 1 tablet by mouth daily. 04/11/20   Claudean Severance, MD  penicillin v potassium (VEETID) 500 MG tablet Take 1 tablet (500 mg total) by mouth 3 (three) times daily. 11/04/20   Mesner, Barbara Cower, MD  hydrochlorothiazide (HYDRODIURIL) 25 MG tablet Take 1 tablet (25 mg total) by mouth daily. 11/27/17 02/16/19  Molt, Bethany, DO      Allergies    Patient has no known allergies.    Review of Systems   Review of Systems  HENT:  Positive for congestion and facial swelling.   All other systems reviewed and are negative.  Physical Exam Updated Vital Signs BP (!) 137/110 (BP Location: Left Arm)    Pulse 95    Temp 99.6 F (37.6 C) (Oral)    Resp 14    Ht  5\' 4"  (1.626 m)    Wt 54.4 kg    SpO2 99%    BMI 20.60 kg/m  Physical Exam Vitals and nursing note reviewed.  Constitutional:      Appearance: Normal appearance.  HENT:     Head: Normocephalic.     Nose: Nose normal.     Comments: Mild left facial swelling, no obvious tenderness or deformity    Mouth/Throat:     Mouth: Mucous membranes are moist.  Eyes:     Extraocular Movements: Extraocular movements intact.     Pupils: Pupils are equal, round, and reactive to light.  Cardiovascular:     Rate and Rhythm: Normal rate and regular rhythm.  Pulmonary:     Effort: Pulmonary effort is normal.     Breath sounds: Normal breath sounds.  Abdominal:     General: Abdomen is flat.     Palpations: Abdomen is soft.  Musculoskeletal:        General: Normal range of motion.     Cervical back: Normal range of motion and neck supple.  Skin:    General: Skin is warm.     Capillary Refill: Capillary refill takes less than 2 seconds.  Neurological:     General: No focal deficit present.     Mental Status: She is alert and  oriented to person, place, and time.  Psychiatric:        Mood and Affect: Mood normal.        Behavior: Behavior normal.    ED Results / Procedures / Treatments   Labs (all labs ordered are listed, but only abnormal results are displayed) Labs Reviewed - No data to display  EKG None  Radiology No results found.  Procedures Procedures    Medications Ordered in ED Medications - No data to display  ED Course/ Medical Decision Making/ A&P                           Medical Decision Making Amber Little is a 36 y.o. female here presenting with left facial swelling.  I think likely seasonal allergies or sinus congestion.  Patient did have a fall recently but has no deformity over the zygomatic arch and doesn't need imaging currently. Will give flonase. Will refill her norvasc. Will refer to ENT     Risk OTC drugs.   Final Clinical Impression(s) / ED  Diagnoses Final diagnoses:  None    Rx / DC Orders ED Discharge Orders     None         Charlynne Pander, MD 07/06/21 1626

## 2021-07-06 NOTE — ED Provider Triage Note (Signed)
Emergency Medicine Provider Triage Evaluation Note  Amber Little , a 36 y.o. female  was evaluated in triage.  Pt complains of two days of left sided facial pain.  Her pain often will ease with benadryl but didn't try that this time.  She fell out of bed right before this started.   She states that she rolled out of bed and her leg was caught.  She isn't sure if she hit her face but it has been hurting since.   No changes in vision. No headache.    Physical Exam  BP (!) 137/110 (BP Location: Left Arm)    Pulse 95    Temp 99.6 F (37.6 C) (Oral)    Resp 14    Ht 5\' 4"  (1.626 m)    Wt 54.4 kg    SpO2 99%    BMI 20.60 kg/m  Gen:   Awake, no distress   Resp:  Normal effort  MSK:   Moves extremities without difficulty  Other:  No TTP across left maxilla where pain is.  Slight redness on the face in front of ear near pain that is non tender.   Medical Decision Making  Medically screening exam initiated at 2:26 PM.  Appropriate orders placed.  Amber Little was informed that the remainder of the evaluation will be completed by another provider, this initial triage assessment does not replace that evaluation, and the importance of remaining in the ED until their evaluation is complete.   Patient denies possibly of pregnancy, doesn't have intimate relations with female partners.    Darin Engels, Cristina Gong 07/06/21 1435

## 2021-07-06 NOTE — Discharge Instructions (Addendum)
Your swelling is likely from seasonal allergies or congestion.  Please try Flonase twice daily.  I will refer you to ENT doctor for follow-up.  They can do extensive work-up in the office if you have persistent pain and congestion  See your doctor to follow up your blood pressure   Return to ER if you have worse sinus pain, purulent discharge from the nose or eyes, fever

## 2021-11-01 ENCOUNTER — Encounter: Payer: Self-pay | Admitting: Internal Medicine

## 2021-11-01 ENCOUNTER — Other Ambulatory Visit: Payer: Self-pay

## 2021-11-01 ENCOUNTER — Other Ambulatory Visit (HOSPITAL_COMMUNITY): Payer: Self-pay

## 2021-11-01 ENCOUNTER — Ambulatory Visit (HOSPITAL_COMMUNITY): Payer: Self-pay | Attending: Internal Medicine

## 2021-11-01 ENCOUNTER — Ambulatory Visit: Payer: Self-pay | Admitting: Internal Medicine

## 2021-11-01 ENCOUNTER — Ambulatory Visit (HOSPITAL_COMMUNITY): Payer: No Typology Code available for payment source

## 2021-11-01 VITALS — BP 148/86 | HR 68 | Temp 98.6°F | Ht 64.0 in | Wt 123.1 lb

## 2021-11-01 DIAGNOSIS — R002 Palpitations: Secondary | ICD-10-CM | POA: Insufficient documentation

## 2021-11-01 DIAGNOSIS — I10 Essential (primary) hypertension: Secondary | ICD-10-CM

## 2021-11-01 DIAGNOSIS — Z87891 Personal history of nicotine dependence: Secondary | ICD-10-CM

## 2021-11-01 HISTORY — DX: Palpitations: R00.2

## 2021-11-01 MED ORDER — AMLODIPINE BESYLATE 10 MG PO TABS: 10.0000 mg | ORAL_TABLET | Freq: Every day | ORAL | 3 refills | Status: DC

## 2021-11-01 NOTE — Assessment & Plan Note (Signed)
Patient is complaining of a 48 hour history of palpitations, racing heart, and shortness of breath. Denies nausea, vomiting, chest pain, fever, chills, or syncope. Started suddenly and stopped without any intervention. Endorses some significant life stress but wasn't sure if this would be causing it. She has had palpitations in the past but these only lasted a couple of seconds.   Ideally would refer to cardiology for further workup today. However patient is uninsured. EKG obtained today did have a new T wave inversion in V1 but not in the subsequent leads.   Plan: - orange card paperwork provided - cardiology referral once orange card obtained - strict return precautions discussed to head to ER if palpitations return

## 2021-11-01 NOTE — Progress Notes (Signed)
   CC: palpitations  HPI:  Ms.ANDRIANA CASA is a 36 y.o. PMH noted below, who presents to the [facility] with complaints of palpitations. To see the management of his acute and chronic conditions, please refer to the A&P note under the encounters tab.   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 Gyn rec'd 1 yr PAP + HPV cotesting   Essential hypertension 08/07/2016   History of tobacco use    Review of Systems:  positive for palpitations, shortness of breath, racing heart, negative for chest pain, dizziness, nausea, vomiting, fever, chills  Physical Exam: Gen: young woman in NAD HEENT: normocephalic atraumatic, MMM CV: RRR, no m/r/g   Resp: CTAB, normal WOB  GI: soft, nontender MSK: moves all extremities without difficulty Skin:warm and dry Neuro:alert answering questions appropriately Psych: normal affect   Assessment & Plan:   See Encounters Tab for problem based charting.  Patient discussed with Dr. Heide Spark

## 2021-11-01 NOTE — Patient Instructions (Signed)
Amber Little  It was a pleasure seeing you in the clinic today.   We talked about your blood pressure and your chest palpitations.  Chest palpitations- if you have any further episodes of this I want you to go to the ER.  Insurance coverage- please work on filling out paperwork to get our financial assistance here so we can send you to the heart doctor.  Please call our clinic at [phone] if you have any questions or concerns. The best time to call is Monday-Friday from 9am-4pm, but there is someone available 24/7 at the same number. If you need medication refills, please notify your pharmacy one week in advance and they will send [place] a request.   Thank you for letting us take part in your care. We look forward to seeing you next time!

## 2021-11-01 NOTE — Assessment & Plan Note (Signed)
BP above goal today however patient says that she ran out of her medication. Discussed with patient that she is able to refill these via mychart or by calling the clinic and requesting a refill rather than needing to schedule an appointment.  Plan: - refilled amlodipine 10 - recheck BP at next visit once medication is restarted

## 2021-11-02 ENCOUNTER — Other Ambulatory Visit (HOSPITAL_COMMUNITY): Payer: Self-pay

## 2021-11-02 NOTE — Progress Notes (Signed)
Internal Medicine Clinic Attending ° °Case discussed with Dr. DeMaio  At the time of the visit.  We reviewed the resident’s history and exam and pertinent patient test results.  I agree with the assessment, diagnosis, and plan of care documented in the resident’s note. ° ° °

## 2022-02-13 NOTE — Progress Notes (Deleted)
   CC: HTN follow up  HPI:  [name] is a 36 y.o. with medical history of HTN, tobacco use disorder presenting to [facility] for HTN follow up.   Please see problem-based list for further details, assessments, and plans.  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 Gyn rec'd 1 yr PAP + HPV cotesting   Essential hypertension 08/07/2016   History of tobacco use      Current Outpatient Medications (Cardiovascular):    amLODipine (NORVASC) 10 MG tablet, Take 1 tablet (10 mg total) by mouth daily.   Current Outpatient Medications (Analgesics):    acetaminophen (TYLENOL) 500 MG tablet, Take 1 tablet (500 mg total) by mouth every 6 (six) hours as needed.   ibuprofen (ADVIL) 600 MG tablet, Take 1 tablet (600 mg total) by mouth every 6 (six) hours as needed.    Review of Systems:  Review of system negative unless stated in the problem list or HPI.    Physical Exam:  There were no vitals filed for this visit.  Physical Exam General: NAD HENT: NCAT Lungs: CTAB, no wheeze, rhonchi or rales.  Cardiovascular: Normal heart sounds, no r/m/g, 2+ pulses in all extremities. No LE edema Abdomen: No TTP, normal bowel sounds MSK: No asymmetry or muscle atrophy.  Skin: no lesions noted on exposed skin Neuro: Alert and oriented x4. CN grossly intact Psych: Normal mood and normal affect   Assessment & Plan:   No problem-specific Assessment & Plan notes found for this encounter.   See Encounters Tab for problem based charting.  Patient discussed with Dr. {NAMES:[id number]::"Guilloud","Hoffman","Mullen","Narendra","Vincent","Machen","Lau","Hatcher"} Amber Abbot, MD Eligha Bridegroom. [facility] Res[facility], PGY-2

## 2022-02-14 ENCOUNTER — Encounter: Payer: Self-pay | Admitting: Internal Medicine

## 2022-05-09 ENCOUNTER — Encounter (HOSPITAL_COMMUNITY): Payer: Self-pay

## 2022-05-09 ENCOUNTER — Emergency Department (HOSPITAL_COMMUNITY)
Admission: EM | Admit: 2022-05-09 | Discharge: 2022-05-09 | Disposition: A | Discharge status: Home / Self Care | Payer: Self-pay | Attending: Emergency Medicine | Admitting: Emergency Medicine

## 2022-05-09 ENCOUNTER — Other Ambulatory Visit: Payer: Self-pay

## 2022-05-09 ENCOUNTER — Emergency Department (HOSPITAL_COMMUNITY): Payer: Self-pay

## 2022-05-09 DIAGNOSIS — R0789 Other chest pain: Secondary | ICD-10-CM | POA: Insufficient documentation

## 2022-05-09 DIAGNOSIS — R519 Headache, unspecified: Secondary | ICD-10-CM | POA: Insufficient documentation

## 2022-05-09 DIAGNOSIS — D649 Anemia, unspecified: Secondary | ICD-10-CM | POA: Insufficient documentation

## 2022-05-09 DIAGNOSIS — Z79899 Other long term (current) drug therapy: Secondary | ICD-10-CM | POA: Insufficient documentation

## 2022-05-09 DIAGNOSIS — I10 Essential (primary) hypertension: Secondary | ICD-10-CM | POA: Insufficient documentation

## 2022-05-09 DIAGNOSIS — R079 Chest pain, unspecified: Secondary | ICD-10-CM

## 2022-05-09 LAB — CBC
HCT: 34 % — ABNORMAL LOW (ref 36.0–46.0)
Hemoglobin: 11.5 g/dL — ABNORMAL LOW (ref 12.0–15.0)
MCH: 29.2 pg (ref 26.0–34.0)
MCHC: 33.8 g/dL (ref 30.0–36.0)
MCV: 86.3 fL (ref 80.0–100.0)
Platelets: 243 10*3/uL (ref 150–400)
RBC: 3.94 MIL/uL (ref 3.87–5.11)
RDW: 12.5 % (ref 11.5–15.5)
WBC: 5.8 10*3/uL (ref 4.0–10.5)
nRBC: 0 % (ref 0.0–0.2)

## 2022-05-09 LAB — BASIC METABOLIC PANEL
Anion gap: 6 (ref 5–15)
BUN: 10 mg/dL (ref 6–20)
CO2: 22 mmol/L (ref 22–32)
Calcium: 8.8 mg/dL — ABNORMAL LOW (ref 8.9–10.3)
Chloride: 106 mmol/L (ref 98–111)
Creatinine, Ser: 0.75 mg/dL (ref 0.44–1.00)
GFR, Estimated: >60 mL/min (ref >60)
Glucose, Bld: 96 mg/dL (ref 70–99)
Potassium: 3.7 mmol/L (ref 3.5–5.1)
Sodium: 134 mmol/L — ABNORMAL LOW (ref 135–145)

## 2022-05-09 LAB — TROPONIN I (HIGH SENSITIVITY)
Troponin I (High Sensitivity): 3 ng/L (ref <18)
Troponin I (High Sensitivity): 3 ng/L (ref <18)

## 2022-05-09 LAB — I-STAT BETA HCG BLOOD, ED (MC, WL, AP ONLY): I-stat hCG, quantitative: <5 m[IU]/mL (ref <5)

## 2022-05-09 MED ORDER — AMLODIPINE BESYLATE 10 MG PO TABS: 10.0000 mg | ORAL_TABLET | Freq: Every day | ORAL | 3 refills | Status: DC

## 2022-05-09 MED ORDER — ASPIRIN 81 MG PO CHEW: 324.0000 mg | CHEWABLE_TABLET | Freq: Once | ORAL | Status: DC

## 2022-05-09 MED ORDER — ALUM & MAG HYDROXIDE-SIMETH 200-200-20 MG/5ML PO SUSP
30.0000 mL | Freq: Once | ORAL | Status: AC
  Administered 2022-05-09: 30 mL via ORAL
  Filled 2022-05-09: qty 30

## 2022-05-09 NOTE — ED Provider Notes (Signed)
[facility] EMERGENCY DEPARTMENT Provider Note   CSN: [id number] Arrival date & time: 05/09/22  0038     History  Chief Complaint  Patient presents with   Hypertension    Amber Little is a 36 y.o. female who reports a history of hypertension otherwise healthy presented to the ER for evaluation of left-sided chest pain that began around midnight last night.     She reports that last night around midnight she had a mild dull headache her headache was gradual in onset and she reports a history of headaches in the past this prompted her to check her blood pressure.  She reports upon checking her blood pressure it read 125/115?  Patient reports that she then took her Norvasc and developed left-sided chest pain. She describes pain as a sharp mild pain that does not radiate there is no clear aggravating or alleviating factors her pain is gradually improved throughout her 14-hour ER stay.  Patient does report occasional leg swelling when her blood pressure is elevated but none recently.  Patient denies fall, injury, fever, chills, cough/hemoptysis, history of DVT, hormone use, extremity swelling/color change, recent surgery/immobilization, history of cancer, nausea, vomiting, abdominal pain, tobacco use or any additional concerns.  HPI     Home Medications Prior to Admission medications   Medication Sig Start Date End Date Taking? Authorizing Provider  acetaminophen (TYLENOL) 500 MG tablet Take 1 tablet (500 mg total) by mouth every 6 (six) hours as needed. 04/17/19   Law, Waylan Boga, PA-C  amLODipine (NORVASC) 10 MG tablet Take 1 tablet (10 mg total) by mouth daily. 11/01/21 10/27/22  Demaio, Tyrone Apple, MD  ibuprofen (ADVIL) 600 MG tablet Take 1 tablet (600 mg total) by mouth every 6 (six) hours as needed. 04/17/19   Law, Waylan Boga, PA-C  hydrochlorothiazide (HYDRODIURIL) 25 MG tablet Take 1 tablet (25 mg total) by mouth daily. 11/27/17 02/16/19  Molt, Bethany, DO       Allergies    Patient has no known allergies.    Review of Systems   Review of Systems Ten systems are reviewed and are negative for acute change except as noted in the HPI  Physical Exam Updated Vital Signs BP 124/84 (BP Location: Left Arm)   Pulse 64   Temp 98.5 F (36.9 C) (Oral)   Resp 18   Ht 5\' 4"  (1.626 m)   Wt 56.7 kg   SpO2 100%   BMI 21.46 kg/m  Physical Exam Constitutional:      General: She is not in acute distress.    Appearance: Normal appearance. She is well-developed. She is not ill-appearing or diaphoretic.  HENT:     Head: Normocephalic and atraumatic.     Nose: Nose normal.  Eyes:     General: Vision grossly intact. Gaze aligned appropriately.     Pupils: Pupils are equal, round, and reactive to light.  Neck:     Trachea: Trachea and phonation normal.  Cardiovascular:     Rate and Rhythm: Normal rate and regular rhythm.     Pulses: Normal pulses.  Pulmonary:     Effort: Pulmonary effort is normal. No respiratory distress.     Breath sounds: Normal breath sounds.  Abdominal:     General: There is no distension.     Palpations: Abdomen is soft.     Tenderness: There is no abdominal tenderness. There is no guarding or rebound.  Musculoskeletal:        General: Normal range of motion.  Cervical back: Normal range of motion.     Right lower leg: No edema.     Left lower leg: No edema.  Skin:    General: Skin is warm and dry.  Neurological:     Mental Status: She is alert.     GCS: GCS eye subscore is 4. GCS verbal subscore is 5. GCS motor subscore is 6.     Comments: Speech is clear and goal oriented, follows commands Major Cranial nerves without deficit, no facial droop Normal strength in upper and lower extremities bilaterally including dorsiflexion and plantar flexion, strong and equal grip strength Sensation normal to light and sharp touch Moves extremities without ataxia, coordination intact Normal finger to nose and rapid alternating  movements Neg romberg, no pronator drift  Psychiatric:        Behavior: Behavior normal.     ED Results / Procedures / Treatments   Labs (all labs ordered are listed, but only abnormal results are displayed) Labs Reviewed  BASIC METABOLIC PANEL - Abnormal; Notable for the following components:      Result Value   Sodium 134 (*)    Calcium 8.8 (*)    All other components within normal limits  CBC - Abnormal; Notable for the following components:   Hemoglobin 11.5 (*)    HCT 34.0 (*)    All other components within normal limits  I-STAT BETA HCG BLOOD, ED ([facility], [facility], [facility] ONLY)  TROPONIN I (HIGH SENSITIVITY)  TROPONIN I (HIGH SENSITIVITY)    EKG EKG Interpretation  Date/Time:  Wednesday May 09 2022 00:46:11 EST Ventricular Rate:  70 PR Interval:  112 QRS Duration: 74 QT Interval:  382 QTC Calculation: 412 R Axis:   71 Text Interpretation: Normal sinus rhythm with sinus arrhythmia Normal ECG When compared with ECG of 01-Nov-2021 15:15, No significant change was found Confirmed by Dione Booze ([id number]) on 05/09/2022 3:53:11 AM  Radiology DG Chest 2 View  Result Date: 05/09/2022 CLINICAL DATA:  Chest pain EXAM: CHEST - 2 VIEW COMPARISON:  10/29/2017 FINDINGS: The heart size and mediastinal contours are within normal limits. Both lungs are clear. The visualized skeletal structures are unremarkable. IMPRESSION: Normal study. Electronically Signed   By: Charlett Nose M.D.   On: 05/09/2022 01:42    Procedures Procedures    Medications Ordered in ED Medications  aspirin chewable tablet 324 mg (has no administration in time range)    ED Course/ Medical Decision Making/ A&P                           Medical Decision Making 36 year old female history of hypertension presenting for chest pain.  She reports that yesterday she developed a mild aching headache which was gradual in onset.  This prompted her to check her blood pressure and when she took a Norvasc when she noticed it  was slightly did her headache is resolved she denies any thunderclap headache or neurosymptoms.  Neuro examination is reassuring at this time and her headache is improved.  She has no meningeal signs or infectious symptoms.  No complaints of vision changes or other concerning headache symptoms.  She reports her chest pain was left-sided and sharp pain is gradually improved throughout the day.  Initial troponin and the triage area was negative however if she had not yet obtained a delta troponin.  She is negative her Wells and PERC criteria, low suspicion for DVT at this time.   Amount and/or Complexity of  Data Reviewed Labs:     Details: BMP shows no emergent electro derangement, AKI or gap. Pregnancy test negative. Initial high-sensitivity troponin within normal limits. CBC shows mild anemia of 11.5.  No leukocytosis to suggest infectious process or thrombocytopenia. Radiology:     Details: I have reviewed and interpreted patient's 2 view chest x-ray, I do not appreciate any obvious PTX, PNA or other acute cardiopulmonary process. ECG/medicine tests:     Details: I first reviewed and interpreted patient's twelve-lead EKG.  I do not appreciate any obvious acute ischemic changes.  She has normal sinus rhythm.   On evaluation patient is well-appearing and in no acute distress she reports her left-sided chest pain has nearly completely resolved at this time. VSS. She is awaiting a delta troponin.  Care handoff given to Langston Masker, PA-C at time of shift change.  Plan of care is to wait till troponin and reassess, anticipate likely discharge pending no emergent changes.  Patient's case discussed with Dr. Renaye Rakers who agrees with plan.  Note: Portions of this report may have been transcribed using voice recognition software. Every effort was made to ensure accuracy; however, inadvertent computerized transcription errors may still be present.         Final Clinical Impression(s) / ED  Diagnoses Final diagnoses:  None    Rx / DC Orders ED Discharge Orders     None         Elizabeth Palau 05/09/22 1520    Terald Sleeper, MD 05/09/22 1538

## 2022-05-09 NOTE — ED Provider Triage Note (Signed)
Emergency Medicine Provider Triage Evaluation Note  Amber Little , a 36 y.o. female  was evaluated in triage.  Pt complains of HTN. Has been out of her medication x 1 month. Tonight began to "feel bad" with a right frontal headache, chest tightness and SOB. Checked her blood pressure at home which was "high"; subsequently took a tablet of her mother's BP medication and an Aleve without relief. Does report spontaneous resolution of her headache.  Only currently complaining of a left-sided mild chest tightness.  Denies fever, syncope, leg swelling.  Review of Systems  Positive: As above Negative: As above  Physical Exam  BP (!) 163/103 (BP Location: Right Arm)   Pulse 63   Temp 98.6 F (37 C) (Oral)   Resp 16   Ht 5\' 4"  (1.626 m)   Wt 56.7 kg   SpO2 100%   BMI 21.46 kg/m  Gen:   Awake, no distress   Resp:  Normal effort  MSK:   Moves extremities without difficulty  Other:  Lungs CTAB. Heart RRR. No BLE edema.  Medical Decision Making  Medically screening exam initiated at 1:27 AM.  Appropriate orders placed.  NIKKI GLANZER was informed that the remainder of the evaluation will be completed by another provider, this initial triage assessment does not replace that evaluation, and the importance of remaining in the ED until their evaluation is complete.  Symptomatic HTN - labs initiated to assess for hypertensive urgency/emergency.    Darin Engels, PA-C 05/09/22 0129

## 2022-05-09 NOTE — ED Triage Notes (Signed)
Pt arrived to triage complaining of HTN, chest pain and headache above right eye   Ran out of her HTN medications about 1 wks ago  She took her moms blood pressure medications today to try and get her pressures down   Chest pain 6/10 and the left side

## 2022-05-09 NOTE — Discharge Instructions (Signed)
Follow up with your Physicain for recheck  

## 2023-02-21 ENCOUNTER — Encounter: Payer: Self-pay | Admitting: Student

## 2023-02-21 ENCOUNTER — Ambulatory Visit: Payer: Self-pay | Admitting: Student

## 2023-02-21 ENCOUNTER — Other Ambulatory Visit (HOSPITAL_COMMUNITY): Payer: Self-pay

## 2023-02-21 VITALS — BP 145/99 | HR 62 | Temp 98.5°F | Wt 128.9 lb

## 2023-02-21 DIAGNOSIS — I10 Essential (primary) hypertension: Secondary | ICD-10-CM

## 2023-02-21 DIAGNOSIS — Z0000 Encounter for general adult medical examination without abnormal findings: Secondary | ICD-10-CM

## 2023-02-21 MED ORDER — AMLODIPINE BESYLATE 10 MG PO TABS
10.0000 mg | ORAL_TABLET | Freq: Every day | ORAL | 3 refills | Status: DC
  Filled 2023-02-21: qty 90, 90d supply, fill #0

## 2023-02-21 MED ORDER — AMLODIPINE BESYLATE 10 MG PO TABS
10.0000 mg | ORAL_TABLET | Freq: Every day | ORAL | 3 refills | Status: DC
  Filled 2023-02-21: qty 30, 30d supply, fill #0
  Filled 2023-04-26: qty 30, 30d supply, fill #1

## 2023-02-21 NOTE — Assessment & Plan Note (Addendum)
Patient has been taking 10 mg of amlodipine daily.  She is unsure what her pressures have been at home but she has not been taking her medication for a month as she ran out of her refills.  She denies any headaches, vision changes, nausea, vomiting, shortness of breath, or other signs or symptoms.  She notes that she will occasionally have swelling in her legs but this has not happened since February.  We will continue to monitor this in subsequent visits. - Continue amlodipine 10 mg daily

## 2023-02-21 NOTE — Patient Instructions (Signed)
Thank you so much for coming to the clinic today!   For your blood pressure, I have sent in a prescription to the community pharmacy.  For your flu shot, we encourage you to get this at your local [facility] or [facility].   If you have any questions please feel free to the call the clinic at anytime at [phone]. It was a pleasure seeing you!  Best, Dr. Rayvon Char

## 2023-02-21 NOTE — Assessment & Plan Note (Signed)
Denies flu shot

## 2023-02-21 NOTE — Progress Notes (Signed)
CC: blood pressure follow-up  HPI: Amber Little is a 37 y.o. female living with a history stated below and presents today for blood pressure follow-up. Please see problem based assessment and plan for additional details.  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 Gyn rec'd 1 yr PAP + HPV cotesting   Essential hypertension 08/07/2016   History of tobacco use     Current Outpatient Medications on File Prior to Visit  Medication Sig Dispense Refill   acetaminophen (TYLENOL) 500 MG tablet Take 1 tablet (500 mg total) by mouth every 6 (six) hours as needed. 30 tablet 0   ibuprofen (ADVIL) 600 MG tablet Take 1 tablet (600 mg total) by mouth every 6 (six) hours as needed. 30 tablet 0   [DISCONTINUED] hydrochlorothiazide (HYDRODIURIL) 25 MG tablet Take 1 tablet (25 mg total) by mouth daily. 90 tablet 1   No current facility-administered medications on file prior to visit.    Family History  Problem Relation Age of Onset   Diabetes Mother    Hypertension Mother    Hypertension Father    Hypertension Paternal Grandmother    Cancer Neg Hx     Social History   Socioeconomic History   Marital status: Single    Spouse name: Not on file   Number of children: Not on file   Years of education: Not on file   Highest education level: Not on file  Occupational History   Not on file  Tobacco Use   Smoking status: Former    Current packs/day: 0.50    Types: Cigarettes   Smokeless tobacco: Never  Substance and Sexual Activity   Alcohol use: Yes    Comment: rarely   Drug use: No   Sexual activity: Not on file    Comment: states she is not sexually active?  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    Social Determinants of Health   Financial Resource Strain: Not on file  Food Insecurity: Not on file   Transportation Needs: Not on file  Physical Activity: Not on file  Stress: Not on file  Social Connections: Unknown (10/17/2021)   Received from [facility], [facility]   Social Network    Social Network: Not on file  Intimate Partner Violence: Unknown (09/08/2021)   Received from [facility], [facility]   HITS    Physically Hurt: Not on file    Insult or Talk Down To: Not on file    Threaten Physical Harm: Not on file    Scream or Curse: Not on file    Review of Systems: ROS negative except for what is noted on the assessment and plan.  Vitals:   02/21/23 1425 02/21/23 1455  BP: (!) 148/102 (!) 145/99  Pulse: 67 62  Temp: 98.5 F (36.9 C)   TempSrc: Oral   Weight: 128 lb 14.4 oz (58.5 kg)     Physical Exam: Constitutional: well-appearing in no acute distress HENT: normocephalic atraumatic, mucous membranes moist Eyes: conjunctiva non-erythematous Neck: supple Cardiovascular: regular rate and rhythm, no m/r/g Pulmonary/Chest: normal work of breathing on room air, lungs clear to auscultation bilaterally Abdominal: soft, non-tender, non-distended MSK: normal bulk and tone Neurological: alert & oriented x 3, 5/5 strength in bilateral upper and lower extremities, normal gait Skin:  warm and dry   Assessment & Plan:   Essential hypertension Patient has been taking 10 mg of amlodipine daily.  She is unsure what her pressures have been at home but she has not been taking her medication for a month as she ran out of her refills.  She denies any headaches, vision changes, nausea, vomiting, shortness of breath, or other signs or symptoms.  She notes that she will occasionally have swelling in her legs but this has not happened since February.  We will continue to monitor this in subsequent visits. - Continue amlodipine 10 mg daily  Healthcare maintenance Denies flu shot   Patient seen with Dr. Henderson Newcomer, MD  [facility] Internal Medicine, PGY-1 Date  02/21/2023 Time 3:14 PM

## 2023-02-28 NOTE — Progress Notes (Signed)
Internal Medicine Clinic Attending  I was physically present during the key portions of the resident provided service and participated in the medical decision making of patient's management care. I reviewed pertinent patient test results.  The assessment, diagnosis, and plan were formulated together and I agree with the documentation in the resident's note.  Inez Catalina, MD

## 2023-02-28 NOTE — Addendum Note (Signed)
Addended by: Debe Coder B on: 02/28/2023 09:46 AM   Modules accepted: Level of Service

## 2023-04-17 ENCOUNTER — Telehealth: Payer: Self-pay | Admitting: Internal Medicine

## 2023-04-17 NOTE — Telephone Encounter (Signed)
Return pt's call - stated one of side effects of Amlodipine is swelling. She has swelling of her hands, knees, and feet. She stated this started the second day after she started taking it. Marland Kitchen

## 2023-04-17 NOTE — Telephone Encounter (Signed)
Pt states since she has been taking the following medication she has noticed swelling. LOV 02/21/2023  Pt is requesting a refill but in concerned about taking the medication below  amLODipine (NORVASC) 10 MG tablet   [name] [place]

## 2023-04-18 NOTE — Telephone Encounter (Signed)
Patient has been on norvasc/ amlodipine intermittently since 2021. She noted since restart medication in September that she has had swelling in lower extremities and hands. She takes blood pressures at home but is unsure of values. She did not know that amlodipine and norvasc were same medication but reports she did not swell previously when taking norvasc. She denies shortness of breath P: Follow-up in clinic Asked her to hold medication if swelling worsens. Unclear if this is from medication versus new symptoms from something else. Needs in person evaluation

## 2023-04-24 ENCOUNTER — Encounter: Payer: Self-pay | Admitting: Internal Medicine

## 2023-04-26 ENCOUNTER — Other Ambulatory Visit (HOSPITAL_COMMUNITY): Payer: Self-pay

## 2023-05-06 ENCOUNTER — Ambulatory Visit (HOSPITAL_COMMUNITY)
Admission: EM | Admit: 2023-05-06 | Discharge: 2023-05-06 | Disposition: A | Discharge status: Home / Self Care | Payer: Self-pay | Attending: Physician Assistant | Admitting: Physician Assistant

## 2023-05-06 ENCOUNTER — Encounter (HOSPITAL_COMMUNITY): Payer: Self-pay | Admitting: Emergency Medicine

## 2023-05-06 DIAGNOSIS — J019 Acute sinusitis, unspecified: Secondary | ICD-10-CM

## 2023-05-06 DIAGNOSIS — R051 Acute cough: Secondary | ICD-10-CM

## 2023-05-06 MED ORDER — AMOXICILLIN-POT CLAVULANATE 875-125 MG PO TABS: 1.0000 | ORAL_TABLET | Freq: Two times a day (BID) | ORAL | 0 refills | Status: DC

## 2023-05-06 MED ORDER — PROMETHAZINE-DM 6.25-15 MG/5ML PO SYRP: 5.0000 mL | ORAL_SOLUTION | Freq: Four times a day (QID) | ORAL | 0 refills | Status: DC | PRN

## 2023-05-06 NOTE — ED Triage Notes (Signed)
Pt c/o cough, congestion, and chest discomfort since Thursday.

## 2023-05-06 NOTE — ED Provider Notes (Signed)
[facility]    CSN: [id number] Arrival date & time: 05/06/23  1620      History   Chief Complaint Chief Complaint  Patient presents with   Cough    HPI Amber Little is a 37 y.o. female.   Pt complains of congestion, sinus press, cough that started about one week ago.  Reports cough is worse at night.  Denies shortness of breath or wheezing.  She has been taking Mucinex with some improvement.  Denies fever, chills, body aches.  She denies sick contacts.  Pt stayed home from work today due to sx.     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 No pertinent surgical history.  OB History   No obstetric history on file.      Home Medications    Prior to Admission medications   Medication Sig Start Date End Date Taking? Authorizing Provider  amoxicillin-clavulanate (AUGMENTIN) 875-125 MG tablet Take 1 tablet by mouth every 12 (twelve) hours. 05/06/23  Yes Ward, Tylene Fantasia, PA-C  promethazine-dextromethorphan (PROMETHAZINE-DM) 6.25-15 MG/5ML syrup Take 5 mLs by mouth 4 (four) times daily as needed for cough. 05/06/23  Yes Ward, Tylene Fantasia, PA-C  acetaminophen (TYLENOL) 500 MG tablet Take 1 tablet (500 mg total) by mouth every 6 (six) hours as needed. Patient not taking: Reported on 05/06/2023 04/17/19   Emi Holes, PA-C   amLODipine (NORVASC) 10 MG tablet Take 1 tablet (10 mg total) by mouth daily. 02/21/23   Morrie Sheldon, MD  ibuprofen (ADVIL) 600 MG tablet Take 1 tablet (600 mg total) by mouth every 6 (six) hours as needed. Patient not taking: Reported on 05/06/2023 04/17/19   Emi Holes, PA-C  hydrochlorothiazide (HYDRODIURIL) 25 MG tablet Take 1 tablet (25 mg total) by mouth daily. 11/27/17 02/16/19  Molt, Bethany, DO    Family History Family History  Problem Relation Age of Onset   Diabetes Mother    Hypertension Mother    Hypertension Father    Hypertension Paternal Grandmother    Cancer Neg Hx     Social History Social History   Tobacco Use   Smoking status: Former    Current packs/day: 0.50    Types: Cigarettes   Smokeless tobacco: Never  Substance Use Topics   Alcohol use: Yes    Comment: rarely   Drug use: No     Allergies   Patient has no known allergies.   Review of Systems Review of Systems  Constitutional:  Negative for chills and fever.  HENT:  Positive for congestion, rhinorrhea and sinus pressure. Negative for ear pain and sore throat.   Eyes:  Negative for pain and visual disturbance.  Respiratory:  Positive for cough. Negative for shortness of breath.   Cardiovascular:  Negative for chest pain and palpitations.  Gastrointestinal:  Negative for abdominal pain and vomiting.  Genitourinary:  Negative for dysuria and hematuria.  Musculoskeletal:  Negative for arthralgias and back pain.  Skin:  Negative for color change and rash.  Neurological:  Negative for seizures and syncope.  All other systems reviewed and are negative.    Physical Exam Triage Vital Signs ED Triage Vitals  Encounter Vitals Group     BP 05/06/23 1712 134/83     Systolic BP Percentile --      Diastolic BP Percentile --      Pulse Rate 05/06/23 1712 83     Resp 05/06/23 1712 17     Temp 05/06/23 1712 98.3 F (36.8 C)     Temp Source 05/06/23 1712 Oral     SpO2 05/06/23 1712 97 %      Weight --      Height --      Head Circumference --      Peak Flow --      Pain Score 05/06/23 1716 5     Pain Loc --      Pain Education --      Exclude from Growth Chart --    No data found.  Updated Vital Signs BP 134/83 (BP Location: Right Arm)   Pulse 83   Temp 98.3 F (36.8 C) (Oral)   Resp 17   LMP 05/06/2023 (Exact Date)   SpO2 97%   Visual Acuity Right Eye Distance:   Left Eye Distance:   Bilateral Distance:    Right Eye Near:   Left Eye Near:    Bilateral Near:     Physical Exam Vitals and nursing note reviewed.  Constitutional:      General: She is not in acute distress.    Appearance: She is well-developed.  HENT:     Head: Normocephalic and atraumatic.     Right Ear: Tympanic membrane is bulging.     Left Ear: Tympanic membrane is bulging.  Eyes:     Conjunctiva/sclera: Conjunctivae normal.  Cardiovascular:     Rate and Rhythm: Normal rate and regular rhythm.     Heart sounds: No murmur heard. Pulmonary:     Effort: Pulmonary effort is normal. No respiratory distress.     Breath sounds: Normal breath sounds.  Abdominal:     Palpations: Abdomen is soft.     Tenderness: There is no abdominal tenderness.  Musculoskeletal:        General: No swelling.     Cervical back: Neck supple.  Skin:    General: Skin is warm and dry.     Capillary Refill: Capillary refill takes less than 2 seconds.  Neurological:     Mental Status: She is alert.  Psychiatric:        Mood and Affect: Mood normal.      UC Treatments / Results  Labs (all labs ordered are listed, but only abnormal results are displayed) Labs Reviewed - No data to display  EKG   Radiology No results found.  Procedures Procedures (including critical care time)  Medications Ordered in UC Medications - No data to display  Initial Impression / Assessment and Plan / UC Course  I have reviewed the triage vital signs and the nursing notes.  Pertinent labs & imaging results that  were available during my care of the patient were reviewed by me and considered in my medical decision making (see chart for details).     Will treat for acute bacterial sinusitis.  Cough syrup prescribed.  Lungs clear on exam.  Supportive care discussed, return precautions discussed.  Work note given.  Final Clinical Impressions(s) / UC Diagnoses   Final diagnoses:  Acute non-recurrent sinusitis, unspecified location  Acute cough     Discharge Instructions      Take antibiotic as prescribed Can take cough syrup as needed Continue with Mucinex Drink plenty of fluids, rest    ED Prescriptions     Medication Sig Dispense Auth. Provider   promethazine-dextromethorphan (PROMETHAZINE-DM) 6.25-15 MG/5ML syrup Take 5 mLs by mouth 4 (four) times daily as needed for cough. 118 mL Ward, Shanda Bumps Z, PA-C   amoxicillin-clavulanate (AUGMENTIN) 875-125 MG tablet Take 1 tablet by mouth every 12 (twelve) hours. 14 tablet Ward, Tylene Fantasia, PA-C      PDMP not reviewed this encounter.   Ward, Tylene Fantasia, PA-C 05/06/23 [phone]

## 2023-05-06 NOTE — Discharge Instructions (Signed)
Take antibiotic as prescribed Can take cough syrup as needed Continue with Mucinex Drink plenty of fluids, rest

## 2023-05-20 ENCOUNTER — Ambulatory Visit: Payer: Self-pay | Admitting: Internal Medicine

## 2023-05-20 ENCOUNTER — Encounter: Payer: Self-pay | Admitting: Internal Medicine

## 2023-05-20 ENCOUNTER — Other Ambulatory Visit (HOSPITAL_COMMUNITY): Payer: Self-pay

## 2023-05-20 VITALS — BP 139/86 | HR 79 | Temp 98.2°F | Wt 131.3 lb

## 2023-05-20 DIAGNOSIS — I10 Essential (primary) hypertension: Secondary | ICD-10-CM

## 2023-05-20 DIAGNOSIS — Z0000 Encounter for general adult medical examination without abnormal findings: Secondary | ICD-10-CM

## 2023-05-20 DIAGNOSIS — R87618 Other abnormal cytological findings on specimens from cervix uteri: Secondary | ICD-10-CM

## 2023-05-20 DIAGNOSIS — R590 Localized enlarged lymph nodes: Secondary | ICD-10-CM

## 2023-05-20 DIAGNOSIS — D649 Anemia, unspecified: Secondary | ICD-10-CM

## 2023-05-20 MED ORDER — HYDROCHLOROTHIAZIDE 25 MG PO TABS
25.0000 mg | ORAL_TABLET | Freq: Every day | ORAL | 0 refills | Status: DC
  Filled 2023-05-20: qty 30, 30d supply, fill #0

## 2023-05-20 NOTE — Progress Notes (Signed)
   CC: follow up  HPI:  Amber Little is a 37 y.o. with medical history of HTN presenting to [facility] for an urgent care follow up. Urgent care visit on 12/02 for symptoms of acute sinusitis. Was prescribed Augmentin but no dispense shown.   Please see problem-based list for further details, assessments, and plans.  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 Gyn rec'd 1 yr PAP + HPV cotesting   Encounter for female birth control 04/12/2020   Essential hypertension 08/07/2016   GERD (gastroesophageal reflux disease) 11/27/2017   History of tobacco use    Intermenstrual bleeding 12/31/2018   Palpitations 11/01/2021     Current Outpatient Medications (Cardiovascular):    hydrochlorothiazide (HYDRODIURIL) 25 MG tablet, Take 1 tablet (25 mg total) by mouth daily.   Current Outpatient Medications (Analgesics):    acetaminophen (TYLENOL) 500 MG tablet, Take 1 tablet (500 mg total) by mouth every 6 (six) hours as needed. (Patient not taking: Reported on 05/06/2023)    Review of Systems:  Review of system negative unless stated in the problem list or HPI.    Physical Exam:  Vitals:   05/20/23 1559  BP: 139/86  Pulse: 79  Temp: 98.2 F (36.8 C)  TempSrc: Oral  Weight: 131 lb 5 oz (59.6 kg)   Physical Exam General: NAD HENT: 2 lymph nodes in the submandibular region that are hard and non-tender, very mild swelling on right temple. Lungs: CTAB, no wheeze, rhonchi or rales.  Cardiovascular: Normal heart sounds, no r/m/g, 2+ pulses in all extremities. No LE edema Abdomen: No TTP, normal bowel sounds MSK: No asymmetry or muscle atrophy.  Skin: no lesions noted on exposed skin Neuro: Alert and oriented x4. CN grossly intact Psych: Normal mood and normal affect   Assessment & Plan:   Essential hypertension Pt with hx of HTN who is on amlodipine 10 mg daily. Given she has bilateral lower  extremity edema and some mild facial edema that pt is reporting. Will discontinue his amlodipine and start hydrochlorothiazide at 25 mg daily. Bmp ordered this visit. Will monitor her swelling discontinuation of amlodipine.   Reactive cervical lymphadenopathy Pt has two submandibular lymph nodes that are non-tender and firm. She had URI symptoms 2 weeks and was treated for sinusitis. This can explain her lymphadenopathy as being reactive. If this does not resolve at follow up, will investigate further. Does have some hx of tobacco use but no red flag symptoms. Monitor at follow up visit.   Pap smear abnormality of cervix/human papillomavirus (HPV) positive Pt needs repeat pap smear. She is currently un-insured. Well women screening brochure provided.   Healthcare maintenance Gave patient information regarding medicaid enrollment. Pt will look into this and apply for this.    See Encounters Tab for problem based charting.  Patient Discussed with Dr. Bryson Corona, MD Eligha Bridegroom. [facility], PGY-3

## 2023-05-20 NOTE — Assessment & Plan Note (Signed)
Pt with hx of HTN who is on amlodipine 10 mg daily. Given she has bilateral lower extremity edema and some mild facial edema that pt is reporting. Will discontinue his amlodipine and start hydrochlorothiazide at 25 mg daily. Bmp ordered this visit. Will monitor her swelling discontinuation of amlodipine.

## 2023-05-20 NOTE — Patient Instructions (Signed)
Amber Little, it was a pleasure seeing you today! You endorsed feeling well today. Below are some of the things we talked about this visit. We look forward to seeing you in the follow up appointment!  Today we discussed: We are going to switch your blood pressure medication from amlodipine to hydrochlorothiazide. Please take one tablet daily. Come back in 4-6 week for a follow up visit.   For your pap smear please call Breast and Cervical Control Program. The number is in the pamphlet. I gave you information regarding medicaid. Please review it and fill out if you meet the requirements listed on this.   I have ordered the following labs today:   Lab Orders         CBC with Diff         BMP8+Anion Gap       Referrals ordered today:   Referral Orders  No referral(s) requested today     I have ordered the following medication/changed the following medications:   Stop the following medications: Medications Discontinued During This Encounter  Medication Reason   amoxicillin-clavulanate (AUGMENTIN) 875-125 MG tablet Completed Course   ibuprofen (ADVIL) 600 MG tablet Patient has not taken in last 30 days   promethazine-dextromethorphan (PROMETHAZINE-DM) 6.25-15 MG/5ML syrup Patient has not taken in last 30 days   amLODipine (NORVASC) 10 MG tablet Discontinued by provider     Start the following medications: Meds ordered this encounter  Medications   hydrochlorothiazide (HYDRODIURIL) 25 MG tablet    Sig: Take 1 tablet (25 mg total) by mouth daily.    Dispense:  30 tablet    Refill:  0    IM program     Follow-up: 6 week follow up   Please make sure to arrive 15 minutes prior to your next appointment. If you arrive late, you may be asked to reschedule.   We look forward to seeing you next time. Please call our clinic at [phone] if you have any questions or concerns. The best time to call is Monday-Friday from 9am-4pm, but there is someone available 24/7. If after hours  or the weekend, call the main hospital number and ask for the Internal Medicine Resident On-Call. If you need medication refills, please notify your pharmacy one week in advance and they will send [place] a request.  Thank you for letting us take part in your care. Wishing you the best!  Thank you, Gwenevere Abbot, MD

## 2023-05-21 LAB — BMP8+ANION GAP
Anion Gap: 14 mmol/L (ref 10.0–18.0)
BUN/Creatinine Ratio: 22 (ref 9–23)
BUN: 14 mg/dL (ref 6–20)
CO2: 22 mmol/L (ref 20–29)
Calcium: 9 mg/dL (ref 8.7–10.2)
Chloride: 102 mmol/L (ref 96–106)
Creatinine, Ser: 0.63 mg/dL (ref 0.57–1.00)
Glucose: 77 mg/dL (ref 70–99)
Potassium: 4 mmol/L (ref 3.5–5.2)
Sodium: 138 mmol/L (ref 134–144)
eGFR: 117 mL/min/{1.73_m2} (ref >59)

## 2023-05-21 LAB — CBC WITH DIFFERENTIAL/PLATELET
Basophils Absolute: 0.1 10*3/uL (ref 0.0–0.2)
Basos: 1 %
EOS (ABSOLUTE): 0.1 10*3/uL (ref 0.0–0.4)
Eos: 3 %
Hematocrit: 34.1 % (ref 34.0–46.6)
Hemoglobin: 10.9 g/dL — ABNORMAL LOW (ref 11.1–15.9)
Immature Grans (Abs): 0 10*3/uL (ref 0.0–0.1)
Immature Granulocytes: 0 %
Lymphocytes Absolute: 2 10*3/uL (ref 0.7–3.1)
Lymphs: 42 %
MCH: 27.7 pg (ref 26.6–33.0)
MCHC: 32 g/dL (ref 31.5–35.7)
MCV: 87 fL (ref 79–97)
Monocytes Absolute: 0.5 10*3/uL (ref 0.1–0.9)
Monocytes: 11 %
Neutrophils Absolute: 2 10*3/uL (ref 1.4–7.0)
Neutrophils: 43 %
Platelets: 271 10*3/uL (ref 150–450)
RBC: 3.94 x10E6/uL (ref 3.77–5.28)
RDW: 12.7 % (ref 11.7–15.4)
WBC: 4.8 10*3/uL (ref 3.4–10.8)

## 2023-05-21 NOTE — Assessment & Plan Note (Signed)
Pt needs repeat pap smear. She is currently un-insured. Well women screening brochure provided.

## 2023-05-21 NOTE — Assessment & Plan Note (Signed)
Pt has two submandibular lymph nodes that are non-tender and firm. She had URI symptoms 2 weeks and was treated for sinusitis. This can explain her lymphadenopathy as being reactive. If this does not resolve at follow up, will investigate further. Does have some hx of tobacco use but no red flag symptoms. Monitor at follow up visit.

## 2023-05-21 NOTE — Progress Notes (Signed)
Internal Medicine Clinic Attending  Case discussed with the resident at the time of the visit.  We reviewed the resident's history and exam and pertinent patient test results.  I agree with the assessment, diagnosis, and plan of care documented in the resident's note.  

## 2023-05-21 NOTE — Assessment & Plan Note (Signed)
Gave patient information regarding medicaid enrollment. Pt will look into this and apply for this.

## 2023-07-02 ENCOUNTER — Ambulatory Visit (HOSPITAL_COMMUNITY)
Admission: EM | Admit: 2023-07-02 | Discharge: 2023-07-02 | Disposition: A | Discharge status: Home / Self Care | Payer: Self-pay | Attending: Emergency Medicine | Admitting: Emergency Medicine

## 2023-07-02 ENCOUNTER — Encounter (HOSPITAL_COMMUNITY): Payer: Self-pay

## 2023-07-02 DIAGNOSIS — R220 Localized swelling, mass and lump, head: Secondary | ICD-10-CM

## 2023-07-02 DIAGNOSIS — K0889 Other specified disorders of teeth and supporting structures: Secondary | ICD-10-CM

## 2023-07-02 MED ORDER — AMOXICILLIN-POT CLAVULANATE 875-125 MG PO TABS: 1.0000 | ORAL_TABLET | Freq: Two times a day (BID) | ORAL | 0 refills | Status: DC

## 2023-07-02 MED ORDER — IBUPROFEN 800 MG PO TABS: 800.0000 mg | ORAL_TABLET | Freq: Three times a day (TID) | ORAL | 0 refills | Status: AC

## 2023-07-02 NOTE — ED Provider Notes (Signed)
[facility]    CSN: [id number] Arrival date & time: 07/02/23  1236      History   Chief Complaint Chief Complaint  Patient presents with   Facial Swelling    HPI Amber Little is a 38 y.o. female.   Patient presents with R sided facial swelling that began today.. Patient also endorses dental pain from wisdom teeth. Denies fever and hives.      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 No pertinent surgical history.  OB History   No obstetric history on file.      Home Medications    Prior to Admission medications   Medication Sig Start Date End Date Taking? Authorizing Provider  amoxicillin-clavulanate (AUGMENTIN) 875-125 MG tablet Take 1 tablet by mouth every 12 (twelve) hours. 07/02/23  Yes Susann Givens, Jontavius Rabalais A, NP  ibuprofen (ADVIL) 800 MG tablet Take 1 tablet (800 mg total) by mouth 3 (three) times daily. 07/02/23  Yes Susann Givens, Indio Santilli A, NP  acetaminophen (TYLENOL) 500 MG tablet Take 1 tablet (500 mg total) by mouth every 6 (six) hours as needed. Patient not taking: Reported on 05/06/2023 04/17/19   Emi Holes, PA-C  hydrochlorothiazide (HYDRODIURIL) 25 MG tablet Take 1 tablet (25 mg total) by mouth daily. 05/20/23 06/19/23  Gwenevere Abbot, MD    Family History Family History  Problem Relation Age of Onset   Diabetes Mother     Hypertension Mother    Hypertension Father    Hypertension Paternal Grandmother    Cancer Neg Hx     Social History Social History   Tobacco Use   Smoking status: Former    Current packs/day: 0.50    Types: Cigarettes   Smokeless tobacco: Never  Substance Use Topics   Alcohol use: Yes    Comment: rarely   Drug use: No     Allergies   Patient has no known allergies.   Review of Systems Review of Systems  Constitutional:  Negative for fever.  HENT:  Positive for dental problem and facial swelling.   Skin:  Negative for rash.     Physical Exam Triage Vital Signs ED Triage Vitals  Encounter Vitals Group     BP 07/02/23 1434 (!) 158/89     Systolic BP Percentile --      Diastolic BP Percentile --      Pulse Rate 07/02/23 1434 70     Resp 07/02/23 1434 16     Temp 07/02/23 1434 98 F (36.7 C)     Temp Source 07/02/23 1434 Oral     SpO2 07/02/23 1434 97 %     Weight --      Height --      Head Circumference --      Peak Flow --      Pain Score 07/02/23 1436 5     Pain Loc --  Pain Education --      Exclude from Growth Chart --    No data found.  Updated Vital Signs BP (!) 158/89 (BP Location: Left Arm)   Pulse 70   Temp 98 F (36.7 C) (Oral)   Resp 16   LMP 06/29/2023 (Approximate)   SpO2 97%   Visual Acuity Right Eye Distance:   Left Eye Distance:   Bilateral Distance:    Right Eye Near:   Left Eye Near:    Bilateral Near:     Physical Exam Vitals and nursing note reviewed.  Constitutional:      General: She is awake. She is not in acute distress.    Appearance: Normal appearance. She is well-developed and well-groomed. She is not ill-appearing.  HENT:     Head:     Jaw: Swelling present.     Comments: Mild swelling and tenderness noted to R lower jaw.     Mouth/Throat:     Dentition: Abnormal dentition. Gingival swelling present.     Comments: Mild gingival swelling noted around bottom right back molars with wisdom tooth protrusion.   Neurological:     Mental Status: She is alert.  Psychiatric:        Behavior: Behavior is cooperative.      UC Treatments / Results  Labs (all labs ordered are listed, but only abnormal results are displayed) Labs Reviewed - No data to display  EKG   Radiology No results found.  Procedures Procedures (including critical care time)  Medications Ordered in UC Medications - No data to display  Initial Impression / Assessment and Plan / UC Course  I have reviewed the triage vital signs and the nursing notes.  Pertinent labs & imaging results that were available during my care of the patient were reviewed by me and considered in my medical decision making (see chart for details).     Patient presented with right sided facial swelling that began today. Patient also endorses dental pain from wisdom teeth.   Upon assessment patient has mild swelling and tenderness noted to right lower jaw. Mild gingival swelling noted to area surrounding right lower back molars with wisdom teeth protrusion.  Prescribed Augmentin for possible dental infection. Prescribed 800 mg Ibuprofen for pain and swelling. Patient stated that she already has a dentist appointment scheduled in the next few weeks. Discussed follow-up and return precautions.   Final Clinical Impressions(s) / UC Diagnoses   Final diagnoses:  Facial swelling  Pain, dental     Discharge Instructions      Start taking Augmentin twice daily for 7 days for possible dental infection. I have prescribed 800 mg ibuprofen that you can alternate with Tylenol as needed for pain. Keep appointment with dentist. Return here as needed.     ED Prescriptions     Medication Sig Dispense Auth. Provider   ibuprofen (ADVIL) 800 MG tablet Take 1 tablet (800 mg total) by mouth 3 (three) times daily. 21 tablet Wynonia Lawman A, NP   amoxicillin-clavulanate (AUGMENTIN) 875-125 MG tablet Take 1 tablet by mouth every 12 (twelve) hours. 14  tablet Wynonia Lawman A, NP      PDMP not reviewed this encounter.   Wynonia Lawman A, NP 07/02/23 [phone]

## 2023-07-02 NOTE — Discharge Instructions (Signed)
Start taking Augmentin twice daily for 7 days for possible dental infection. I have prescribed 800 mg ibuprofen that you can alternate with Tylenol as needed for pain. Keep appointment with dentist. Return here as needed.

## 2023-07-02 NOTE — ED Triage Notes (Signed)
Pt states she was eating her lunch and the right side of her face started to swell.

## 2023-09-24 ENCOUNTER — Ambulatory Visit (INDEPENDENT_AMBULATORY_CARE_PROVIDER_SITE_OTHER): Payer: Self-pay | Admitting: Student

## 2023-09-24 ENCOUNTER — Other Ambulatory Visit (HOSPITAL_COMMUNITY): Payer: Self-pay

## 2023-09-24 ENCOUNTER — Telehealth: Payer: Self-pay | Admitting: *Deleted

## 2023-09-24 VITALS — BP 158/109 | HR 55 | Temp 98.4°F | Ht 64.0 in | Wt 127.2 lb

## 2023-09-24 DIAGNOSIS — I10 Essential (primary) hypertension: Secondary | ICD-10-CM

## 2023-09-24 DIAGNOSIS — R220 Localized swelling, mass and lump, head: Secondary | ICD-10-CM

## 2023-09-24 MED ORDER — HYDROCHLOROTHIAZIDE 25 MG PO TABS: 25.0000 mg | ORAL_TABLET | Freq: Every day | ORAL | 3 refills | Status: DC

## 2023-09-24 MED ORDER — AMOXICILLIN-POT CLAVULANATE 875-125 MG PO TABS: 1.0000 | ORAL_TABLET | Freq: Two times a day (BID) | ORAL | 0 refills | Status: AC

## 2023-09-24 NOTE — Assessment & Plan Note (Signed)
 Patient presents with a history of hypertension with a blood pressure today of 158/109. Patient filled a 30 day course of hydrochlorothiazide  25 mg in December and she is "still taking the medications".   Prior BMP in December was WNL.   Plan: -Restart regimen of hydrochlorothiazide  25 mg daily  -BMP today  -BP check with nurse in one month, BMP in one month -3 month follow up with provider

## 2023-09-24 NOTE — Assessment & Plan Note (Signed)
 Patient was first seen in December where she had mild R temple edema and lower extremity edema. Amlodipine  was discontinued at that time and she started to use compression stockings; the lower extremity edema has since resolved. Per patient the she had R upper tooth pain and noticed the right temporal swelling afterwards. She has yet to see a dentist to have the tooth evaluated/removed.   This morning, she woke up to use the bathroom and felt nauseated, had bottom lip tingling, and felt slightly lightheaded for about 5 minutes. These symptoms have not returned and they also have not happened prior in the past. She denied fever, chills, weakness, changes to vision, balance changes, weakness, shortness of breath, orthopnea, chest pain, new medications, or new facial topical medication/products. She denied history of HSV as well.   In regard to the "new blood pressure medication", she was started on hydrochlorothiazide  in December and was provided a 30 day supply without refills at that time. Per patient, she has not missed any doses but has not refilled this medication since getting the 30 day supply in December.   I suspect that her right temple swelling is due to the dental infection. The lower extremity edema has resolved previously, and the bilateral shoulder "edema" is tight trapezius muscles. There are no red flag symptoms of stroke or other neurological disease, patient was provided return precautions.   Plan: -Patient informed of the importance to see a dentist asap to have the infected tooth treated/removed -Augmentin  7 day course filled

## 2023-09-24 NOTE — Telephone Encounter (Signed)
 Walk in states is having some swelling in her feet , and shoulders.  Has had a little facial swelling at times with lips that tingle at times.  None presently.  Stated would rather wait to be seen in the Clinics instead of going to the ER.  No shortness of breath today.  Started on a new B/P med.  Given an appointment in the Clinics for this afternoon.  Transportation is an issue.  Will wait for appointment.

## 2023-09-24 NOTE — Patient Instructions (Signed)
 Thank you, Ms.Darnelle Elders for allowing us  to provide your care today. Today we discussed high blood pressure and swelling.  The swelling on the face is most likely due to a tooth infection! We will give you an antibiotic for one week but you need to be seen by a dentist and have the tooth fixed.     Please continue to take hydrochlorothiazide , this medication is not causing swelling.  We will check the kidney function today.   I have ordered the following labs for you:   Lab Orders         Basic metabolic panel with GFR        I have ordered the following medication/changed the following medications:   Stop the following medications: Medications Discontinued During This Encounter  Medication Reason   hydrochlorothiazide  (HYDRODIURIL ) 25 MG tablet Reorder   amoxicillin -clavulanate (AUGMENTIN ) 875-125 MG tablet Reorder     Start the following medications: Meds ordered this encounter  Medications   hydrochlorothiazide  (HYDRODIURIL ) 25 MG tablet    Sig: Take 1 tablet (25 mg total) by mouth daily.    Dispense:  90 tablet    Refill:  3    IM program   amoxicillin -clavulanate (AUGMENTIN ) 875-125 MG tablet    Sig: Take 1 tablet by mouth every 12 (twelve) hours.    Dispense:  14 tablet    Refill:  0     Follow up: 1 month BMP and nurse BP visit, 3 months with provider   Remember: If you have difficulty breathing, worsening swelling, or develop fevers/chills: please be evaluated!  Should you have any questions or concerns please call the internal medicine clinic at [phone].     Please note that our late policy has changed.  If you are more than 15 minutes late to your appointment, you may be asked to reschedule your appointment.  Dr. Sharlon Deacon, D.O. [facility]

## 2023-09-24 NOTE — Progress Notes (Signed)
 Established Patient Office Visit  Subjective   Patient ID: Amber Little, female    DOB: Oct 11, 1985  Age: 38 y.o. MRN: [id number]  Chief Complaint  Patient presents with   Edema    C/o some swelling in her feet and shoulders bilaterally.  Has had a little facial swelling at times with lips that tingle at times.  None presently.  No shortness of breath today.  Started on a new B/P med. Seen in ED in Jan 2025 for similar symptoms   Amber Little is a 38 y.o. who presents to the clinic for an acute visit for swelling of her right temple, bilateral LE, and bilateral shoulders. Patient was first seen in December where she had mild R temple edema and lower extremity edema. Amlodipine  was discontinued at that time and she started to use compression stockings; the lower extremity edema has since resolved. Per patient the she had R upper tooth pain and noticed the right temporal swelling afterwards. She has yet to see a dentist to have the tooth evaluated/removed.   This morning, she woke up to use the bathroom and felt nauseated, had bottom lip tingling, and felt slightly lightheaded for about 5 minutes. These symptoms have not returned and they also have not happened prior in the past. She denied fever, chills, weakness, changes to vision, balance changes, weakness, shortness of breath, orthopnea, chest pain, new medications, or new facial topical medication/products. She denied history of HSV as well.   In regard to the "new blood pressure medication", she was started on hydrochlorothiazide  in December and was provided a 30 day supply without refills at that time. Per patient, she has not missed any doses but has not refilled this medication since getting the 30 day supply in December.   Please see problem based assessment and plan for additional details.    Patient Active Problem List   Diagnosis Date Noted   Right temple edema, mild 09/24/2023   Reactive cervical lymphadenopathy 07/12/2020    Pap smear abnormality of cervix/human papillomavirus (HPV) positive 08/09/2016   Essential hypertension 08/07/2016   Healthcare maintenance 08/07/2016       Objective:     BP (!) 158/109 (BP Location: Left Arm, Patient Position: Sitting, Cuff Size: Normal)   Pulse (!) 55   Temp 98.4 F (36.9 C) (Oral)   Ht 5\' 4"  (1.626 m)   Wt 127 lb 3.2 oz (57.7 kg)   SpO2 99%   BMI 21.83 kg/m  BP Readings from Last 3 Encounters:  09/24/23 (!) 158/109  07/02/23 (!) 158/89  05/20/23 139/86   Wt Readings from Last 3 Encounters:  09/24/23 127 lb 3.2 oz (57.7 kg)  05/20/23 131 lb 5 oz (59.6 kg)  02/21/23 128 lb 14.4 oz (58.5 kg)      Physical Exam Vitals reviewed.  Constitutional:      General: She is not in acute distress.    Appearance: She is not ill-appearing, toxic-appearing or diaphoretic.  HENT:     Head:     Comments: Mild R temple and cheek edema No sores or lesions on the lip Unable to appreciate an abscess     Mouth/Throat:     Mouth: Mucous membranes are moist.     Tongue: No lesions.     Pharynx: Oropharynx is clear. No posterior oropharyngeal erythema.     Comments: No tongue swelling  Neck:     Vascular: No carotid bruit.  Cardiovascular:     Rate and Rhythm:  Normal rate and regular rhythm.     Heart sounds: No murmur heard. Pulmonary:     Effort: Pulmonary effort is normal. No respiratory distress.     Breath sounds: Normal breath sounds. No wheezing or rales.  Abdominal:     General: There is no distension.     Palpations: Abdomen is soft.     Tenderness: There is no abdominal tenderness. There is no guarding.  Musculoskeletal:     Right lower leg: No edema.     Left lower leg: No edema.     Comments: Bilateral trapezius muscles are tight  Skin:    General: Skin is warm and dry.  Neurological:     Mental Status: She is alert.      No results found for any visits on 09/24/23.  Last metabolic panel Lab Results  Component Value Date   GLUCOSE 77  05/20/2023   NA 138 05/20/2023   K 4.0 05/20/2023   CL 102 05/20/2023   CO2 22 05/20/2023   BUN 14 05/20/2023   CREATININE 0.63 05/20/2023   EGFR 117 05/20/2023   CALCIUM 9.0 05/20/2023   PROT 6.9 02/08/2008   ALBUMIN 3.8 02/08/2008   BILITOT 0.6 02/08/2008   ALKPHOS 53 02/08/2008   AST 17 02/08/2008   ALT 14 02/08/2008   ANIONGAP 6 05/09/2022     Assessment & Plan:   Problem List Items Addressed This Visit       Cardiovascular and Mediastinum   Essential hypertension (Chronic)   Patient presents with a history of hypertension with a blood pressure today of 158/109. Patient filled a 30 day course of hydrochlorothiazide  25 mg in December and she is "still taking the medications".   Prior BMP in December was WNL.   Plan: -Restart regimen of hydrochlorothiazide  25 mg daily  -BMP today  -BP check with nurse in one month, BMP in one month -3 month follow up with provider       Relevant Medications   hydrochlorothiazide  (HYDRODIURIL ) 25 MG tablet   Other Relevant Orders   Basic metabolic panel with GFR   Basic metabolic panel with GFR     Other   Right temple edema, mild - Primary   Patient was first seen in December where she had mild R temple edema and lower extremity edema. Amlodipine  was discontinued at that time and she started to use compression stockings; the lower extremity edema has since resolved. Per patient the she had R upper tooth pain and noticed the right temporal swelling afterwards. She has yet to see a dentist to have the tooth evaluated/removed.   This morning, she woke up to use the bathroom and felt nauseated, had bottom lip tingling, and felt slightly lightheaded for about 5 minutes. These symptoms have not returned and they also have not happened prior in the past. She denied fever, chills, weakness, changes to vision, balance changes, weakness, shortness of breath, orthopnea, chest pain, new medications, or new facial topical medication/products. She  denied history of HSV as well.   In regard to the "new blood pressure medication", she was started on hydrochlorothiazide  in December and was provided a 30 day supply without refills at that time. Per patient, she has not missed any doses but has not refilled this medication since getting the 30 day supply in December.   I suspect that her right temple swelling is due to the dental infection. The lower extremity edema has resolved previously, and the bilateral shoulder "edema" is tight trapezius  muscles. There are no red flag symptoms of stroke or other neurological disease, patient was provided return precautions.   Plan: -Patient informed of the importance to see a dentist asap to have the infected tooth treated/removed -Augmentin  7 day course filled        Return in about 4 weeks (around 10/22/2023) for BP, BMP, 3 months with provider .    Aurora Lees, DO

## 2023-09-25 ENCOUNTER — Encounter: Payer: Self-pay | Admitting: Student

## 2023-09-25 LAB — BASIC METABOLIC PANEL WITH GFR
BUN/Creatinine Ratio: 15 (ref 9–23)
BUN: 11 mg/dL (ref 6–20)
CO2: 24 mmol/L (ref 20–29)
Calcium: 9.4 mg/dL (ref 8.7–10.2)
Chloride: 99 mmol/L (ref 96–106)
Creatinine, Ser: 0.74 mg/dL (ref 0.57–1.00)
Glucose: 80 mg/dL (ref 70–99)
Potassium: 3.6 mmol/L (ref 3.5–5.2)
Sodium: 140 mmol/L (ref 134–144)
eGFR: 106 mL/min/{1.73_m2} (ref >59)

## 2023-10-04 NOTE — Progress Notes (Signed)
 Internal Medicine Clinic Attending  Case discussed with the resident at the time of the visit.  We reviewed the resident's history and exam and pertinent patient test results.  I agree with the assessment, diagnosis, and plan of care documented in the resident's note.

## 2023-11-18 ENCOUNTER — Encounter: Payer: Self-pay | Admitting: *Deleted

## 2024-03-10 ENCOUNTER — Ambulatory Visit: Payer: Self-pay

## 2024-03-25 ENCOUNTER — Encounter: Payer: Self-pay | Admitting: Student

## 2024-03-25 NOTE — Progress Notes (Deleted)
   Established Patient Office Visit  Subjective   Patient ID: Amber Little, female    DOB: 08-11-1985  Age: 38 y.o. MRN: [id number]  No chief complaint on file.   Amber Little is a 38 year old female with a past medical history of hypertension who presents today for medication management. Please see problem-based assessment and plan below for details.     ROS    Objective:    There were no vitals taken for this visit.   Physical Exam   No results found for any visits on 03/26/24.   The ASCVD Risk score (Arnett DK, et al., 2019) failed to calculate for the following reasons:   The 2019 ASCVD risk score is only valid for ages 34 to 63    Assessment & Plan:   Patient seen with {[email or web address]/2026:[id number]}.   Problem List Items Addressed This Visit       Cardiovascular and Mediastinum   Essential hypertension - Primary (Chronic)   Chronic, longstanding. Previously on Amlodipine , was switched to hydrochlorothiazide  in 05/2023 due to swelling that may have been contributed to Amlodipine .  BP today ___. Will continue hydrochlorothiazide  25mg  daily.   - Sent refill to pharmacy - BMP today - 3 month follow up        Other   Healthcare maintenance   Last PAP/Cervical cancer screening done in 2018. No record of HPV vaccination. Provided brochure for women's health screening in 05/2023. Discussed the importance of up-to-date pap exams, especially with a history of abnormal Paps. ___ flu shot today.  -        No follow-ups on file.    Jeb Schloemer, DO Internal Medicine Resident, PGY-1 2:03 PM 03/26/2024

## 2024-03-26 ENCOUNTER — Ambulatory Visit: Payer: Self-pay

## 2024-03-26 DIAGNOSIS — I10 Essential (primary) hypertension: Secondary | ICD-10-CM

## 2024-03-26 NOTE — Assessment & Plan Note (Deleted)
 Last PAP/Cervical cancer screening done in 2018. No record of HPV vaccination. Provided brochure for women's health screening in 05/2023. Discussed the importance of up-to-date pap exams, especially with a history of abnormal Paps. ___ flu shot today.  -

## 2024-03-26 NOTE — Assessment & Plan Note (Deleted)
 Chronic, longstanding. Previously on Amlodipine , was switched to hydrochlorothiazide  in 05/2023 due to swelling that may have been contributed to Amlodipine .  BP today ___. Will continue hydrochlorothiazide  25mg  daily.   - Sent refill to pharmacy - BMP today - 3 month follow up

## 2024-03-30 ENCOUNTER — Other Ambulatory Visit (HOSPITAL_COMMUNITY): Payer: Self-pay

## 2024-03-30 ENCOUNTER — Ambulatory Visit: Payer: Self-pay | Admitting: Student

## 2024-03-30 VITALS — BP 149/97 | HR 75 | Temp 98.4°F | Ht 64.0 in | Wt 130.6 lb

## 2024-03-30 DIAGNOSIS — Z87891 Personal history of nicotine dependence: Secondary | ICD-10-CM

## 2024-03-30 DIAGNOSIS — Z79899 Other long term (current) drug therapy: Secondary | ICD-10-CM

## 2024-03-30 DIAGNOSIS — R220 Localized swelling, mass and lump, head: Secondary | ICD-10-CM

## 2024-03-30 DIAGNOSIS — Z91040 Latex allergy status: Secondary | ICD-10-CM

## 2024-03-30 DIAGNOSIS — I10 Essential (primary) hypertension: Secondary | ICD-10-CM

## 2024-03-30 DIAGNOSIS — Z8249 Family history of ischemic heart disease and other diseases of the circulatory system: Secondary | ICD-10-CM

## 2024-03-30 MED ORDER — HYDROCHLOROTHIAZIDE 25 MG PO TABS
25.0000 mg | ORAL_TABLET | Freq: Every day | ORAL | 3 refills | Status: DC
Start: 2024-03-30 — End: 2024-04-29
  Filled 2024-03-30: qty 90, 90d supply, fill #0

## 2024-03-30 NOTE — Progress Notes (Signed)
 "  CC: Low blood pressure, and facial swelling  HPI:  [name] is a 38 y.o. female living with a history stated below and presents today for blood pressure and facial swelling. Please see problem based assessment and plan for additional details.  Discussed the use of AI scribe software for clinical note transcription with the patient, who gave verbal consent to proceed.  History of Present Illness [name] is a 38 year old female with hypertension who presents with facial swelling and requests a refill on her blood pressure medication.  She experiences recurrent swelling on the right side of her face, which initially appears in one area, subsides, and then reappears in another. The swelling is non-painful but sometimes accompanied by a throbbing sensation. It tends to occur during the workweek and subsides on weekends when she is not working. She has not taken any medication for the swelling as she is unsure of the cause.  She mentions a small knot on the back of her head that she feels has increased in size.  She is currently out of her blood pressure medication, hydrochlorothiazide  25 mg, which she has not taken for about a week. When she was taking it, her blood pressure readings were around 115/97 mmHg. The medication initially caused swelling in her legs, which improved with the use of compression socks. However, she still experiences swelling in her hands and face, which she associates with the medication, although the swelling persists even after stopping the medication.  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 She notes that the swelling occurs during work hours when she is wearing latex gloves and improves when she is not at work.  She experiences occasional lightheadedness and has a history of trying different blood pressure medications, including amlodipine , which she felt did not work well for her.    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 Gyn rec'd 1 yr PAP + HPV cotesting   Encounter for female birth control 04/12/2020   Essential hypertension 08/07/2016   GERD (gastroesophageal reflux disease) 11/27/2017   History of tobacco use    Intermenstrual bleeding 12/31/2018   Palpitations 11/01/2021    Current Outpatient Medications on File Prior to Visit  Medication Sig Dispense Refill   acetaminophen  (TYLENOL ) 500 MG tablet Take 1 tablet (500 mg total) by mouth every 6 (six) hours as needed. (Patient not taking: Reported on 05/06/2023) 30 tablet 0   amoxicillin -clavulanate (AUGMENTIN ) 875-125 MG tablet Take 1 tablet by mouth every 12 (twelve) hours. 14 tablet 0   ibuprofen  (ADVIL ) 800 MG tablet Take 1 tablet (800 mg total) by mouth 3 (three) times daily. 21 tablet 0   No current facility-administered medications on file prior to visit.    Family History  Problem Relation Age of Onset   Diabetes Mother    Hypertension Mother    Hypertension Father    Hypertension Paternal Grandmother    Cancer Neg Hx     Social History   Socioeconomic History   Marital status: Single    Spouse name: Not on file   Number of children: Not on file   Years of education: Not on file   Highest education level: Not on file  Occupational History   Not on file  Tobacco Use   Smoking status: Former    Current packs/day:  0.50    Types: Cigarettes   Smokeless tobacco: Never  Substance and Sexual Activity   Alcohol use: Yes    Comment: rarely   Drug use: No   Sexual activity: Not on file    Comment: states she is not sexually active?  Other Topics Concern   Not on file  Social History Narrative   Not working currently, used to work as a LAWYER and also in diet/nutrition.    Social Drivers of Corporate Investment Banker Strain: Not on file  Food Insecurity: Not on file  Transportation Needs: Not on file   Physical Activity: Not on file  Stress: Not on file  Social Connections: Unknown (10/17/2021)   Received from [facility]   Social Network    Social Network: Not on file  Intimate Partner Violence: Unknown (09/08/2021)   Received from [facility]   HITS    Physically Hurt: Not on file    Insult or Talk Down To: Not on file    Threaten Physical Harm: Not on file    Scream or Curse: Not on file    Review of Systems: ROS negative except for what is noted on the assessment and plan.  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 Symptoms resolve when not at work, indicating allergic reaction. No infection or lymphadenopathy. - Write note for workplace to avoid latex gloves. - Recommend non-latex gloves. - Advise ice application for swelling. - Suggest Claritin or Allegra for allergy symptoms. - Discuss Benadryl  use, noting drowsiness. - Plan allergist referral once insurance obtained.  Essential hypertension Hypertension managed with hydrochlorothiazide  25 mg. Blood pressure generally controlled on medication. She has been without medication for a week due to supply issues. Amlodipine  previously not tolerated due to leg swelling. - Refill hydrochlorothiazide  25 mg prescription. - Send prescription to [facility] pharmacy for cost efficiency.   Patient discussed with Dr. CHARLENA Rosan Roetta Nelia, M.D. [facility] Internal  Medicine, PGY-3 Pager: [phone] Date 03/31/2024 Time 8:10 AM  "

## 2024-03-30 NOTE — Patient Instructions (Addendum)
 Thank you so much for coming to the clinic today!   I have refilled your hydrochlorothiazide . I would try taking Claritin, allegra, or benadryl  for your symptoms  If you have any questions please feel free to the call the clinic at anytime at [phone]. It was a pleasure seeing you!  Best, Dr. Madelynn Malson

## 2024-03-31 ENCOUNTER — Other Ambulatory Visit: Payer: Self-pay | Admitting: Student

## 2024-03-31 ENCOUNTER — Telehealth: Payer: Self-pay | Admitting: *Deleted

## 2024-03-31 DIAGNOSIS — I10 Essential (primary) hypertension: Secondary | ICD-10-CM

## 2024-03-31 MED ORDER — HYDROCHLOROTHIAZIDE 25 MG PO TABS: 25.0000 mg | ORAL_TABLET | Freq: Every day | ORAL | 3 refills | Status: AC

## 2024-03-31 NOTE — Assessment & Plan Note (Signed)
 Hypertension managed with hydrochlorothiazide  25 mg. Blood pressure generally controlled on medication. She has been without medication for a week due to supply issues. Amlodipine  previously not tolerated due to leg swelling. - Refill hydrochlorothiazide  25 mg prescription. - Send prescription to [facility] pharmacy for cost efficiency.

## 2024-03-31 NOTE — Assessment & Plan Note (Signed)
 Recurrent swelling during work hours suggests latex allergy from glove use. Symptoms resolve when not at work, indicating allergic reaction. No infection or lymphadenopathy. - Write note for workplace to avoid latex gloves. - Recommend non-latex gloves. - Advise ice application for swelling. - Suggest Claritin or Allegra for allergy symptoms. - Discuss Benadryl  use, noting drowsiness. - Plan allergist referral once insurance obtained.

## 2024-03-31 NOTE — Telephone Encounter (Signed)
 Prescription has been sent to the [facility].                           Copied from CRM [id number]. Topic: Clinical - Prescription Issue >> Mar 30, 2024  5:21 PM Chiquita SQUIBB wrote: Reason for CRM: Patient saw the provider today and her prescription for  hydrochlorothiazide  (HYDRODIURIL ) 25 MG tablet was sent to the [facility] instead of the [id number] [place] ([place]),  - [address] pharmacy. Patient is currently at the pharmacy and is asking if this can be sent over as soon as possible as she has been without it for 5 days. Patient is going to ask the pharmacy if they can transfer it in the mean time since it is after hours, advised patient a message will still be placed just in case.

## 2024-04-02 NOTE — Progress Notes (Signed)
 Internal Medicine Clinic Attending  Case discussed with the resident at the time of the visit.  We reviewed the resident's history and exam and pertinent patient test results.  I agree with the assessment, diagnosis, and plan of care documented in the resident's note.
